# Patient Record
Sex: Female | Born: 1960 | Race: Black or African American | Hispanic: No | Marital: Single | Smoking: Never smoker
Health system: Southern US, Community
[De-identification: ages and names within clinical notes are randomized; demographics above are authoritative.]

## PROBLEM LIST (undated history)

## (undated) DIAGNOSIS — R569 Unspecified convulsions: Secondary | ICD-10-CM

## (undated) HISTORY — PX: EYE SURGERY: SHX253

---

## 2003-04-16 ENCOUNTER — Emergency Department (HOSPITAL_COMMUNITY): Admission: EM | Admit: 2003-04-16 | Discharge: 2003-04-17 | Payer: Self-pay | Admitting: Emergency Medicine

## 2004-11-03 ENCOUNTER — Emergency Department (HOSPITAL_COMMUNITY): Admission: EM | Admit: 2004-11-03 | Discharge: 2004-11-03 | Payer: Self-pay | Admitting: Emergency Medicine

## 2012-09-03 ENCOUNTER — Emergency Department (HOSPITAL_COMMUNITY)
Admission: EM | Admit: 2012-09-03 | Discharge: 2012-09-03 | Disposition: A | Discharge status: Home / Self Care | Payer: Self-pay | Attending: Emergency Medicine | Admitting: Emergency Medicine

## 2012-09-03 ENCOUNTER — Emergency Department (HOSPITAL_COMMUNITY): Payer: Self-pay

## 2012-09-03 ENCOUNTER — Encounter (HOSPITAL_COMMUNITY): Payer: Self-pay

## 2012-09-03 DIAGNOSIS — W268XXA Contact with other sharp object(s), not elsewhere classified, initial encounter: Secondary | ICD-10-CM | POA: Insufficient documentation

## 2012-09-03 DIAGNOSIS — Y92009 Unspecified place in unspecified non-institutional (private) residence as the place of occurrence of the external cause: Secondary | ICD-10-CM | POA: Insufficient documentation

## 2012-09-03 DIAGNOSIS — S61412A Laceration without foreign body of left hand, initial encounter: Secondary | ICD-10-CM

## 2012-09-03 DIAGNOSIS — S61409A Unspecified open wound of unspecified hand, initial encounter: Secondary | ICD-10-CM | POA: Insufficient documentation

## 2012-09-03 DIAGNOSIS — Y998 Other external cause status: Secondary | ICD-10-CM | POA: Insufficient documentation

## 2012-09-03 DIAGNOSIS — Z23 Encounter for immunization: Secondary | ICD-10-CM | POA: Insufficient documentation

## 2012-09-03 MED ORDER — OXYCODONE-ACETAMINOPHEN 5-325 MG PO TABS
2.0000 | ORAL_TABLET | Freq: Once | ORAL | Status: AC
  Administered 2012-09-03: 2 via ORAL
  Filled 2012-09-03: qty 2

## 2012-09-03 MED ORDER — TETANUS-DIPHTH-ACELL PERTUSSIS 5-2.5-18.5 LF-MCG/0.5 IM SUSP
0.5000 mL | Freq: Once | INTRAMUSCULAR | Status: AC
  Administered 2012-09-03: 0.5 mL via INTRAMUSCULAR
  Filled 2012-09-03: qty 0.5

## 2012-09-03 MED ORDER — HYDROCODONE-ACETAMINOPHEN 5-325 MG PO TABS: 1.0000 | ORAL_TABLET | Freq: Four times a day (QID) | ORAL | Status: DC | PRN

## 2012-09-03 MED ORDER — IBUPROFEN 800 MG PO TABS: 800.0000 mg | ORAL_TABLET | Freq: Three times a day (TID) | ORAL | Status: DC

## 2012-09-03 NOTE — ED Notes (Signed)
Laceration to her lt. Palm 1 inch bleeding controlled

## 2012-09-03 NOTE — ED Provider Notes (Signed)
History     CSN: [id number]  Arrival date & time 09/03/12  0725   First MD Initiated Contact with Patient 09/03/12 [phone]      Chief Complaint  Patient presents with  . Laceration    (Consider location/radiation/quality/duration/timing/severity/associated sxs/prior treatment) Patient is a 52 y.o. female presenting with skin laceration. The history is provided by the patient. No language interpreter was used.  Laceration Location:  Hand Hand laceration location:  L hand (Palmar aspect of L hand just distal to wrist joint) Length (cm):  2.5 Quality: straight   Bleeding: controlled   Time since incident:  1 hour Injury mechanism: Patient unsure of mechanism; was attempting to open a window at home and sliced hand. Denies breakage of glass. Pain details:    Severity:  Mild   Timing:  Constant   Progression:  Unchanged Foreign body present: No FB immediately visible. Worsened by:  Pressure Ineffective treatments:  None tried Tetanus status:  Unknown   History reviewed. No pertinent past medical history.  History reviewed. No pertinent past surgical history.  No family history on file.  History  Substance Use Topics  . Smoking status: Not on file  . Smokeless tobacco: Not on file  . Alcohol Use: Not on file    OB History   Grav Para Term Preterm Abortions TAB SAB Ect Mult Living                  Review of Systems  Constitutional:       "feeling queezy" 2/2 site of blood  Musculoskeletal: Positive for myalgias. Negative for joint swelling and arthralgias.  Skin: Positive for wound.  Neurological: Negative for weakness and numbness.  All other systems reviewed and are negative.    Allergies  Review of patient's allergies indicates no known allergies.  Home Medications  No current outpatient prescriptions on file.  BP 126/89  Pulse 99  Temp(Src) 98.2 F (36.8 C) (Oral)  Resp 18  SpO2 100%  LMP 09/03/2012  Physical Exam  Nursing note and vitals  reviewed. Constitutional: She is oriented to person, place, and time. She appears well-developed and well-nourished. No distress.  HENT:  Head: Normocephalic and atraumatic.  Eyes: Conjunctivae and EOM are normal. No scleral icterus.  Neck: Normal range of motion.  Cardiovascular: Normal rate, regular rhythm and intact distal pulses.   Capillary refill normal. Distal radial pulses 2+  Pulmonary/Chest: Effort normal. No respiratory distress.  Musculoskeletal:       Left wrist: Normal.       Left hand: She exhibits tenderness and laceration. She exhibits normal range of motion, no bony tenderness, normal two-point discrimination, normal capillary refill and no deformity. Normal sensation noted. Normal strength noted.       Hands: 2.5 cm laceration to palmar aspect of L hand just distal to L wrist joint. Patient exhibits normal grip strength. No decreased sensation or 2 pt discrimination appreciated. No limited ROM of wrist joint.  Neurological: She is alert and oriented to person, place, and time.  No sensory or motor deficits appreciated.  Skin: Skin is warm and dry. No rash noted. She is not diaphoretic. No erythema. No pallor.  Psychiatric: She has a normal mood and affect. Her behavior is normal.    ED Course  Procedures (including critical care time)  Labs Reviewed - No data to display Dg Hand Complete Left  09/03/2012   *RADIOLOGY REPORT*  Clinical Data: Laceration to palm of hand from a window, question radiopaque foreign  body  LEFT HAND - COMPLETE 3+ VIEW  Comparison: None  Findings: Mild diffuse osseous demineralization. Joint spaces preserved. No acute fracture, dislocation or bone destruction. No definite radiopaque foreign bodies. Dressing artifacts at volar aspect of the proximal hand and carpus.  IMPRESSION: Osseous demineralization. No definite radiopaque foreign bodies or acute osseous findings.   Original Report Authenticated By: Ulyses Southward, M.D.    LACERATION  REPAIR Performed by: Antony Madura Authorized by: Antony Madura Consent: Verbal consent obtained. Risks and benefits: risks, benefits and alternatives were discussed Consent given by: patient Patient identity confirmed: provided demographic data Prepped and Draped in normal sterile fashion Wound explored  Laceration Location: Palmar aspect of L hand  Laceration Length: 2.5cm  No Foreign Bodies seen or palpated  Anesthesia: local infiltration  Local anesthetic: lidocaine 2% without epinephrine  Anesthetic total: 2 ml  Irrigation method: syringe Amount of cleaning: standard  Skin closure: 4-0 black monofilament  Number of sutures: 2  Technique: horizontal mattress  Patient tolerance: Patient tolerated the procedure well with no immediate complications.    1. Hand laceration, left, initial encounter      MDM  Patient is a 52 year old female who presents for a laceration to the palmar aspect of her left hand. Patient neurovascularly intact an x-ray without evidence of radiopaque foreign body. There is no pallor, pulselessness, poikilothermia, or paresthesias. Patient given tetanus in the ED and laceration closed with 2 horizontal mattress sutures. Patient appropriate for discharge with hand specialist f/u. Patient told to have sutures removed in 7-10 days and given instruction on wound care. Indications for ED return provided. Patient states comfort and understanding with plan with no unaddressed concerns.        Antony Madura, PA-C 09/03/12 0930

## 2012-09-03 NOTE — Progress Notes (Signed)
9:15 AM Pt with laceration on left hand, with intact sensation and tendon function, no glass seen on x-ray, to have suture repair.

## 2012-09-04 NOTE — ED Provider Notes (Signed)
Medical screening examination/treatment/procedure(s) were conducted as a shared visit with non-physician practitioner(s) and myself.  I personally evaluated the patient during the encounter Pt suffered laceration of the left hand while trying to open a window.  The laceration is on the palm of the left hand just distal to the flexion crease of her wrist.  There is no loss of sensation or motor function in her hand.  The x-ray of her hand showed no glass.  Advised suture  Repair of laceration.  Carleene Cooper III, MD 09/04/12 1012

## 2012-09-11 ENCOUNTER — Emergency Department (HOSPITAL_COMMUNITY)
Admission: EM | Admit: 2012-09-11 | Discharge: 2012-09-11 | Disposition: A | Discharge status: Home / Self Care | Payer: Self-pay | Attending: Emergency Medicine | Admitting: Emergency Medicine

## 2012-09-11 DIAGNOSIS — Z4802 Encounter for removal of sutures: Secondary | ICD-10-CM

## 2012-09-11 DIAGNOSIS — T148XXA Other injury of unspecified body region, initial encounter: Secondary | ICD-10-CM

## 2012-09-11 NOTE — ED Notes (Signed)
Pt received stitches last Wednesday, came to have removed today

## 2012-09-11 NOTE — ED Provider Notes (Signed)
   History    CSN: [id number] Arrival date & time 09/11/12  0807  First MD Initiated Contact with Patient 09/11/12 [phone]     Chief Complaint  Patient presents with  . Suture / Staple Removal   (Consider location/radiation/quality/duration/timing/severity/associated sxs/prior Treatment) HPI  52 year old female presents emergency department for suture removal.  Patient was seen 1 week ago laceration to the left palm.  She states she is unsure how it happened but occurred while she was trying to open her window.  She cannot find an object which cut her hand.  Patient also complains of swelling and tenderness under the suture site.  She denies any heat, redness, discharge or signs of infection.  Patient denies any paresthesia of the fingers.  She is able to move her hand and fingers with full range of motion and strength. She denies any fevers, chills, nausea, vomiting, myalgias or arthralgias.  No past medical history on file. No past surgical history on file. No family history on file. History  Substance Use Topics  . Smoking status: Not on file  . Smokeless tobacco: Not on file  . Alcohol Use: Not on file   OB History   Grav Para Term Preterm Abortions TAB SAB Ect Mult Living                 Review of Systems As stated in history of present illness Allergies  Percocet  Home Medications  No current outpatient prescriptions on file. BP 125/96  Pulse 84  Temp(Src) 98.4 F (36.9 C) (Oral)  Resp 18  SpO2 100%  LMP 09/03/2012 Physical Exam Physical Exam  Nursing note and vitals reviewed. Constitutional: She is oriented to person, place, and time. She appears well-developed and well-nourished. No distress.  HENT:  Head: Normocephalic and atraumatic.  Eyes: Conjunctivae normal and EOM are normal. Pupils are equal, round, and reactive to light. No scleral icterus.  Neck: Normal range of motion.  Cardiovascular: Normal rate, regular rhythm and normal heart sounds.  Exam  reveals no gallop and no friction rub.   No murmur heard. Pulmonary/Chest: Effort normal and breath sounds normal. No respiratory distress.  Abdominal: Soft. Bowel sounds are normal. She exhibits no distension and no mass. There is no tenderness. There is no guarding.  Neurological: She is alert and oriented to person, place, and time.  Skin: Skin is warm and dry. She is not diaphoretic.  2.5 cm laceration to the left hand.  2 horizontal mattress sutures in place.  There is swelling and ecchymosis under the wound site without heat, redness, or discharge.  Site is tender to palpation.   ED Course  Procedures (including critical care time)  SUTURE REMOVAL Performed by: Arthor Captain  Consent: Verbal consent obtained. Patient identity confirmed: provided demographic data Time out: Immediately prior to procedure a "time out" was called to verify the correct patient, procedure, equipment, support staff and site/side marked as required.  Location details: L hand  Wound Appearance: clean  Sutures/Staples Removed: 2  Facility: sutures placed in this facility Patient tolerance: Patient tolerated the procedure well with no immediate complications.    Labs Reviewed - No data to display No results found. 1. Visit for suture removal   2. Hematoma     MDM  Patient with well healed laceration and hematoma. No signs of infection. The patient has been given instructions for wound care. Return precautions discussed.    Arthor Captain, PA-C 09/11/12 1637

## 2012-09-13 NOTE — ED Provider Notes (Signed)
Medical screening examination/treatment/procedure(s) were performed by non-physician practitioner and as supervising physician I was immediately available for consultation/collaboration.  Genasis Zingale, MD 09/13/12 1702 

## 2018-05-29 ENCOUNTER — Emergency Department (HOSPITAL_COMMUNITY): Payer: Self-pay

## 2018-05-29 ENCOUNTER — Encounter (HOSPITAL_COMMUNITY): Payer: Self-pay

## 2018-05-29 ENCOUNTER — Other Ambulatory Visit: Payer: Self-pay

## 2018-05-29 ENCOUNTER — Emergency Department (HOSPITAL_COMMUNITY)
Admission: EM | Admit: 2018-05-29 | Discharge: 2018-05-29 | Disposition: A | Discharge status: Home / Self Care | Payer: Self-pay | Attending: Emergency Medicine | Admitting: Emergency Medicine

## 2018-05-29 DIAGNOSIS — R1084 Generalized abdominal pain: Secondary | ICD-10-CM

## 2018-05-29 DIAGNOSIS — A5901 Trichomonal vulvovaginitis: Secondary | ICD-10-CM | POA: Insufficient documentation

## 2018-05-29 DIAGNOSIS — A599 Trichomoniasis, unspecified: Secondary | ICD-10-CM

## 2018-05-29 DIAGNOSIS — R1903 Right lower quadrant abdominal swelling, mass and lump: Secondary | ICD-10-CM | POA: Insufficient documentation

## 2018-05-29 DIAGNOSIS — Z91040 Latex allergy status: Secondary | ICD-10-CM | POA: Insufficient documentation

## 2018-05-29 LAB — URINALYSIS, ROUTINE W REFLEX MICROSCOPIC
BILIRUBIN URINE: NEGATIVE
GLUCOSE, UA: NEGATIVE mg/dL
Ketones, ur: NEGATIVE mg/dL
NITRITE: NEGATIVE
Protein, ur: NEGATIVE mg/dL
SPECIFIC GRAVITY, URINE: 1.035 — AB (ref 1.005–1.030)
pH: 7 (ref 5.0–8.0)

## 2018-05-29 LAB — BASIC METABOLIC PANEL
ANION GAP: 8 (ref 5–15)
BUN: 8 mg/dL (ref 6–20)
CO2: 24 mmol/L (ref 22–32)
CREATININE: 0.78 mg/dL (ref 0.44–1.00)
Calcium: 9.2 mg/dL (ref 8.9–10.3)
Chloride: 104 mmol/L (ref 98–111)
GFR calc non Af Amer: >60 mL/min (ref >60)
Glucose, Bld: 131 mg/dL — ABNORMAL HIGH (ref 70–99)
Potassium: 3.3 mmol/L — ABNORMAL LOW (ref 3.5–5.1)
SODIUM: 136 mmol/L (ref 135–145)

## 2018-05-29 LAB — CBC
HEMATOCRIT: 39.2 % (ref 36.0–46.0)
Hemoglobin: 12.8 g/dL (ref 12.0–15.0)
MCH: 30.4 pg (ref 26.0–34.0)
MCHC: 32.7 g/dL (ref 30.0–36.0)
MCV: 93.1 fL (ref 80.0–100.0)
NRBC: 0 % (ref 0.0–0.2)
Platelets: 321 10*3/uL (ref 150–400)
RBC: 4.21 MIL/uL (ref 3.87–5.11)
RDW: 13.2 % (ref 11.5–15.5)
WBC: 7.3 10*3/uL (ref 4.0–10.5)

## 2018-05-29 LAB — I-STAT TROPONIN, ED: Troponin i, poc: 0.01 ng/mL (ref 0.00–0.08)

## 2018-05-29 MED ORDER — SODIUM CHLORIDE 0.9 % IV SOLN
INTRAVENOUS | Status: DC
  Administered 2018-05-29: 19:00:00 via INTRAVENOUS

## 2018-05-29 MED ORDER — METRONIDAZOLE 500 MG PO TABS: 500.0000 mg | ORAL_TABLET | Freq: Two times a day (BID) | ORAL | 0 refills | Status: DC

## 2018-05-29 MED ORDER — SODIUM CHLORIDE 0.9% FLUSH
3.0000 mL | Freq: Once | INTRAVENOUS | Status: AC
  Administered 2018-05-29: 3 mL via INTRAVENOUS

## 2018-05-29 MED ORDER — IOHEXOL 300 MG/ML  SOLN
100.0000 mL | Freq: Once | INTRAMUSCULAR | Status: AC | PRN
  Administered 2018-05-29: 100 mL via INTRAVENOUS

## 2018-05-29 MED ORDER — METRONIDAZOLE 500 MG PO TABS
500.0000 mg | ORAL_TABLET | Freq: Once | ORAL | Status: AC
  Administered 2018-05-29: 500 mg via ORAL
  Filled 2018-05-29: qty 1

## 2018-05-29 NOTE — Discharge Instructions (Signed)
As discussed, it is important that you follow-up with our oncology gynecology team. Please be sure to ensure follow-up, possibly tomorrow.  Return here for concerning changes in your condition.

## 2018-05-29 NOTE — ED Triage Notes (Signed)
Pt states she has been having shortness of breath, weakness, dizziness X2 weeks. Reports some intermittent chest pain as well. Skin warm and dry. Vitals stable.

## 2018-05-29 NOTE — ED Provider Notes (Signed)
[facility] EMERGENCY DEPARTMENT Provider Note   CSN: [id number] Arrival date & time: 05/29/18  1134    History   Chief Complaint Chief Complaint  Patient presents with   Shortness of Breath    HPI Lindsey Ortega is a 58 y.o. female.     HPI Patient is very poor historian, presents with multiple complaints. Patient states that she is generally well, was so until about 2 weeks ago. Now over the past 2 weeks the patient has had progressive generalized weakness, soreness, as well as diffuse abdominal pain, nausea, vomiting, diarrhea. No reported fever, no chills. No ability to take medication for symptom relief. No confusion, disorientation, weakness in her extremities. Patient states that she is taking no prescription medication regularly, has not received her influenza vaccine. History reviewed. No pertinent past medical history.  There are no active problems to display for this patient.   History reviewed. No pertinent surgical history.   OB History   No obstetric history on file.      Home Medications    Prior to Admission medications   Not on File    Family History History reviewed. No pertinent family history.  Social History Social History   Tobacco Use   Smoking status: Never Smoker   Smokeless tobacco: Never Used  Substance Use Topics   Alcohol use: Yes    Comment: occasional    Drug use: Never     Allergies   Percocet [oxycodone-acetaminophen] and Latex   Review of Systems Review of Systems  Constitutional:       Per HPI, otherwise negative  HENT:       Per HPI, otherwise negative  Respiratory:       Per HPI, otherwise negative  Cardiovascular:       Per HPI, otherwise negative  Gastrointestinal: Positive for abdominal pain, diarrhea, nausea and vomiting.  Endocrine:       Negative aside from HPI  Genitourinary:       Neg aside from HPI   Musculoskeletal:       Per HPI, otherwise negative  Skin:  Negative.   Neurological: Positive for weakness. Negative for syncope.     Physical Exam Updated Vital Signs BP (!) 166/98    Pulse 67    Temp 98.8 F (37.1 C) (Oral)    Resp 17    SpO2 100%   Physical Exam Vitals signs and nursing note reviewed.  Constitutional:      General: She is not in acute distress.    Appearance: She is well-developed.  HENT:     Head: Normocephalic and atraumatic.  Eyes:     Conjunctiva/sclera: Conjunctivae normal.  Cardiovascular:     Rate and Rhythm: Normal rate and regular rhythm.  Pulmonary:     Effort: Pulmonary effort is normal. No respiratory distress.     Breath sounds: Normal breath sounds. No stridor.  Abdominal:     General: There is no distension.     Tenderness: There is generalized abdominal tenderness.  Skin:    General: Skin is warm and dry.  Neurological:     Mental Status: She is alert and oriented to person, place, and time.     Cranial Nerves: No cranial nerve deficit.      ED Treatments / Results  Labs (all labs ordered are listed, but only abnormal results are displayed) Labs Reviewed  BASIC METABOLIC PANEL - Abnormal; Notable for the following components:      Result Value  Potassium 3.3 (*)    Glucose, Bld 131 (*)    All other components within normal limits  URINALYSIS, ROUTINE W REFLEX MICROSCOPIC - Abnormal; Notable for the following components:   APPearance CLOUDY (*)    Specific Gravity, Urine 1.035 (*)    Hgb urine dipstick SMALL (*)    Leukocytes,Ua LARGE (*)    Bacteria, UA MANY (*)    Trichomonas, UA PRESENT (*)    All other components within normal limits  CBC  I-STAT TROPONIN, ED    EKG EKG Interpretation  Date/Time:  Thursday May 29 2018 11:42:47 EDT Ventricular Rate:  83 PR Interval:  164 QRS Duration: 74 QT Interval:  352 QTC Calculation: 413 R Axis:   63 Text Interpretation:  Normal sinus rhythm with sinus arrhythmia Borderline ECG Confirmed by Carmin Muskrat [phone]) on 05/29/2018  5:32:00 PM   Radiology Dg Chest 2 View  Result Date: 05/29/2018 CLINICAL DATA:  Acute chest pain and shortness of breath for 2 weeks. EXAM: CHEST - 2 VIEW COMPARISON:  None. FINDINGS: The cardiomediastinal silhouette is unremarkable. There is no evidence of focal airspace disease, pulmonary edema, suspicious pulmonary nodule/mass, pleural effusion, or pneumothorax. No acute bony abnormalities are identified. IMPRESSION: No active cardiopulmonary disease. Electronically Signed   By: Margarette Canada M.D.   On: 05/29/2018 13:28   Ct Abdomen Pelvis W Contrast  Result Date: 05/29/2018 CLINICAL DATA:  Abdominal pain. Diverticulitis suspected. EXAM: CT ABDOMEN AND PELVIS WITH CONTRAST TECHNIQUE: Multidetector CT imaging of the abdomen and pelvis was performed using the standard protocol following bolus administration of intravenous contrast. CONTRAST:  122mL OMNIPAQUE IOHEXOL 300 MG/ML  SOLN COMPARISON:  None. 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 There is no evidence for gallstones, gallbladder wall thickening, or pericholecystic fluid. No intrahepatic or extrahepatic biliary dilation. Pancreas: No focal mass lesion. No dilatation of the main duct. No intraparenchymal cyst. No peripancreatic edema. Spleen: No splenomegaly. No focal mass lesion. Adrenals/Urinary Tract: No adrenal nodule or mass. Kidneys unremarkable. No evidence for hydroureter. The urinary bladder appears normal for the degree of distention. Stomach/Bowel: Moderate hiatal hernia. Stomach otherwise unremarkable. Duodenum is normally positioned as is the ligament of Treitz. No small bowel wall thickening. No small bowel dilatation. The terminal ileum is normal. The appendix is normal. No gross colonic mass. No colonic wall thickening. Diverticular changes are noted in the  left colon without evidence of diverticulitis. Vascular/Lymphatic: No abdominal aortic aneurysm. No abdominal aortic atherosclerotic calcification. There is no gastrohepatic or hepatoduodenal ligament lymphadenopathy. No intraperitoneal or retroperitoneal lymphadenopathy. No pelvic sidewall lymphadenopathy. Reproductive: The uterus is unremarkable. 5.6 x 5.5 x 4.5 cm cystic and solid lesion is identified in the right adnexal space, displacing the uterus to the left. This lesion has an enhancing 4.0 x 3.1 x 4.0 cm soft tissue component. No left adnexal mass. Other: No intraperitoneal free fluid. Musculoskeletal: No worrisome lytic or sclerotic osseous abnormality. IMPRESSION: 1. 5.6 x 5.5 x 4.5 cm cystic and solid mass in the right adnexal space, displacing the uterus to the left. Imaging features are highly suspicious for ovarian neoplasm. Pelvic ultrasound may prove helpful to further evaluate. Gyn consultation suggested. 2. 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 3. Moderate hiatal hernia. Electronically Signed   By: Verda Cumins.D.  On: 05/29/2018 19:58    Procedures Procedures (including critical care time)  Medications Ordered in ED Medications  0.9 %  sodium chloride infusion ( Intravenous New Bag/Given 05/29/18 1903)  sodium chloride flush (NS) 0.9 % injection 3 mL (3 mLs Intravenous Given 05/29/18 1903)  iohexol (OMNIPAQUE) 300 MG/ML solution 100 mL (100 mLs Intravenous Contrast Given 05/29/18 1932)  metroNIDAZOLE (FLAGYL) tablet 500 mg (500 mg Oral Given 05/29/18 2144)     Initial Impression / Assessment and Plan / ED Course  I have reviewed the triage vital signs and the nursing notes.  Pertinent labs & imaging results that were available during my care of the patient were reviewed by me and considered in my medical decision making (see chart for details).        9:52  PM Repeat exam the patient is in similar condition. I discussed the patient's case with our gynecology oncology colleagues, to facilitate follow-up. CT concerning for new malignancy. Labs generally reassuring aside from abnormal urinalysis, positive for trichomonas. I discussed this with the patient, who notes that she had recent slip up. She received initial Flagyl here, will have a prescription for home use. We again discussed the abnormal CT, and the importance of close outpatient follow-up, which has been facilitated. With concern for malignancy, patient encouraged to follow-up tomorrow, and she should receive a call from the office as well.   Final Clinical Impressions(s) / ED Diagnoses  Abdominal pain Abdominal mass Trichomonas   Carmin Muskrat, MD 05/29/18 2155

## 2018-05-30 ENCOUNTER — Telehealth: Payer: Self-pay | Admitting: *Deleted

## 2018-05-30 NOTE — Telephone Encounter (Signed)
Called and scheduled the patient for a new patient appt. Patient scheduled to see Dr.Rossi on 3/18

## 2018-06-02 ENCOUNTER — Telehealth: Payer: Self-pay | Admitting: *Deleted

## 2018-06-02 NOTE — Telephone Encounter (Signed)
Called and spoke with the patient regarding her appt for Wednesday. I stated "we will still see you on Wednesday and to please limit to only one visitor with you. Please call and cancel if you are sick or have been around anyone that is sick; same goes for  Your visitor." Patient verbalized understanding

## 2018-06-04 ENCOUNTER — Other Ambulatory Visit: Payer: Self-pay

## 2018-06-04 ENCOUNTER — Encounter: Payer: Self-pay | Admitting: Gynecologic Oncology

## 2018-06-04 ENCOUNTER — Inpatient Hospital Stay: Payer: Self-pay | Attending: Gynecologic Oncology

## 2018-06-04 ENCOUNTER — Inpatient Hospital Stay (HOSPITAL_BASED_OUTPATIENT_CLINIC_OR_DEPARTMENT_OTHER): Payer: Self-pay | Admitting: Gynecologic Oncology

## 2018-06-04 VITALS — BP 140/94 | HR 86 | Temp 98.8°F | Resp 18 | Ht 65.5 in | Wt 141.6 lb

## 2018-06-04 DIAGNOSIS — R110 Nausea: Secondary | ICD-10-CM

## 2018-06-04 DIAGNOSIS — Z780 Asymptomatic menopausal state: Secondary | ICD-10-CM | POA: Insufficient documentation

## 2018-06-04 DIAGNOSIS — N83201 Unspecified ovarian cyst, right side: Secondary | ICD-10-CM | POA: Insufficient documentation

## 2018-06-04 DIAGNOSIS — R42 Dizziness and giddiness: Secondary | ICD-10-CM | POA: Insufficient documentation

## 2018-06-04 DIAGNOSIS — N9489 Other specified conditions associated with female genital organs and menstrual cycle: Secondary | ICD-10-CM

## 2018-06-04 MED ORDER — MECLIZINE HCL 12.5 MG PO TABS: 12.5000 mg | ORAL_TABLET | Freq: Three times a day (TID) | ORAL | 0 refills | Status: DC | PRN

## 2018-06-04 NOTE — Progress Notes (Signed)
Consult Note: Gyn-Onc  Consult was requested by Dr. Carmin Muskrat for the evaluation of Lindsey Ortega 58 y.o. female  CC:  Chief Complaint  Patient presents with  . Adnexal mass    Assessment/Plan:  Lindsey Ortega  is a 58 y.o.  year old with a 5.6cm cystic and solid right ovarian mass.  After reviewing the CT images, I have a low suspicion for malignancy. We will check tumor markers. If normal, we will plan to schedule a surgery for her in May, after current limitations on OR scheduling (COVID-19 pandemic) are relaxed, we will schedule her surgery (robotic assisted LSO).  If her tumor markers are elevated, we will likely proceed with surgery as soon as we have available time and space.   With respect to her dizziness and nausea, are unrelated to her pelvic mass which is an incidental finding on imaging.  I prescribed her meclizine presumptively for vertigo.  I discussed that she should follow-up with a primary care doctor regarding this symptom.  HPI: Lindsey Ortega is a 58 year old P3 who was seen in consultation at the request of Dr. Carmin Muskrat from the emergency department at [facility] for a right ovarian cyst.  The patient reports symptoms of intermittent nausea and dizziness for approximately 1 month.  She was seen in the ED at the [facility] where a CT scan of the abdomen and pelvis was performed on May 29, 2018 and this revealed a 5.6 x 5.5 x 5 4.5 cm cystic and solid mass in the right adnexal space displacing the uterus to the left.  There was no ascites carcinomatosis or lymphadenopathy.  The patient's past surgical history is unremarkable with the exception of eye surgery.  She underwent menopause approximately 1 year ago and has had no postmenopausal bleeding.  Her past medical history is insignificant however the patient does not have a primary care doctor and does not seek medical help usually and therefore it is unclear what underlying  health issues she has.  Her gynecologic obstetric history significant for 2 prior vaginal deliveries and 1 cesarean section.  She denies ever having an abnormal Pap smear, though is unclear if she has had much cervical cancer screening in her life.  Her family history is unremarkable and she has no gynecologic or breast malignancies or colon cancer in her family.  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   Current Meds:  Outpatient Encounter Medications as of 06/04/2018  Medication Sig  . meclizine (ANTIVERT) 12.5 MG tablet Take 1 tablet (12.5 mg total) by mouth 3 (three) times daily as needed for dizziness.  . metroNIDAZOLE (FLAGYL) 500 MG tablet Take 1 tablet (500 mg total) by mouth 2 (two) times daily. (Patient not taking: Reported on 06/04/2018)   No facility-administered encounter medications on file as of 06/04/2018.     Allergy:  Allergies  Allergen Reactions  . Percocet [Oxycodone-Acetaminophen] Nausea And Vomiting    Patient doesn't recall this ??  . Latex Rash    NO POWDERED GLOVES!!    Social Hx:   Social History   Socioeconomic History  . Marital status: Single    Spouse name: Not on file  . Number of children: Not on file  . Years of education: Not on file  . Highest education level: Not on file  Occupational History  . Not on file  Social Needs  . Financial resource strain: Not on file  .  Food insecurity:    Worry: Not on file    Inability: Not on file  . Transportation needs:    Medical: Not on file    Non-medical: Not on file  Tobacco Use  . Smoking status: Never Smoker  . Smokeless tobacco: Never Used  Substance and Sexual Activity  . Alcohol use: Yes    Comment: occasional   . Drug use: Never  . Sexual activity: Not Currently  Lifestyle  . Physical activity:    Days per week: Not on file    Minutes per session: Not on file  . Stress: Not on file  Relationships  . Social connections:    Talks on  phone: Not on file    Gets together: Not on file    Attends religious service: Not on file    Active member of club or organization: Not on file    Attends meetings of clubs or organizations: Not on file    Relationship status: Not on file  . Intimate partner violence:    Fear of current or ex partner: Not on file    Emotionally abused: Not on file    Physically abused: Not on file    Forced sexual activity: Not on file  Other Topics Concern  . Not on file  Social History Narrative  . Not on file    Past Surgical Hx: History reviewed. No pertinent surgical history.  Past Medical Hx: History reviewed. No pertinent past medical history.  Past Gynecological History:  See HPI No LMP recorded.  Family Hx: History reviewed. No pertinent family history.  Review of Systems:  Constitutional  Feels well,    ENT Normal appearing ears and nares bilaterally Skin/Breast  No rash, sores, jaundice, itching, dryness Cardiovascular  No chest pain, shortness of breath, or edema  Pulmonary  No cough or wheeze.  Gastro Intestinal  + nausea Genito Urinary  No frequency, urgency, dysuria, no bleeding Musculo Skeletal  No myalgia, arthralgia, joint swelling or pain  Neurologic  dizziness  Psychology  No depression, anxiety, insomnia.   Vitals:  Blood pressure (!) 140/94, pulse 86, temperature 98.8 F (37.1 C), temperature source Oral, resp. rate 18, height 5' 5.5" (1.664 m), weight 141 lb 9.6 oz (64.2 kg), SpO2 100 %.  Physical Exam: WD in NAD Neck  Supple NROM, without any enlargements.  Lymph Node Survey No cervical supraclavicular or inguinal adenopathy Cardiovascular  Pulse normal rate, regularity and rhythm. S1 and S2 normal.  Lungs  Clear to auscultation bilateraly, without wheezes/crackles/rhonchi. Good air movement.  Skin  No rash/lesions/breakdown  Psychiatry  Alert and oriented to person, place, and time  Abdomen  Normoactive bowel sounds, abdomen soft, non-tender  and obese without evidence of hernia.  Back No CVA tenderness Genito Urinary  Vulva/vagina: Normal external female genitalia.  No lesions. No discharge or bleeding.  Bladder/urethra:  No lesions or masses, well supported bladder  Vagina: normal  Cervix: Normal appearing, no lesions.  Uterus:  Small, mobile, no parametrial involvement or nodularity.  Adnexa: no palpable masses. Rectal  Good tone, no masses no cul de sac nodularity.  Extremities  No bilateral cyanosis, clubbing or edema.   Thereasa Solo, MD  06/04/2018, 5:12 PM

## 2018-06-04 NOTE — Patient Instructions (Signed)
Dr Denman George is checking your tumor marker.  If this is elevated she will schedule a surgery to remove the right ovary as soon as she has clearance from the hospital to do so. If this is normal, she will see you back for a checkup in May to discuss whether you would like to proceed with surgery.  Her contact office is [place].  Dr Denman George recommends that you try meclizine tablets for your nausea and dizziness. If this does not help, you should see a primary care doctor to be evaluated.   She is clearing you to return to work.

## 2018-06-05 ENCOUNTER — Telehealth: Payer: Self-pay

## 2018-06-05 LAB — CA 125: CANCER ANTIGEN (CA) 125: 14.7 U/mL (ref 0.0–38.1)

## 2018-06-05 NOTE — Telephone Encounter (Signed)
Outgoing call to patient regarding recent CA 125 result as "within normal range" per Joylene John NP- pt voiced understanding and asked if still has to have surgery?  Per Lenna Sciara NP, 'this is good CA 125 result but still needs surgery to remove mass and test by pathology, will schedule surgery in May.' and reminded her to call if any worsening of symptoms before May- pt voiced understanding- no other needs per pt at this time.

## 2018-06-10 ENCOUNTER — Telehealth: Payer: Self-pay | Admitting: *Deleted

## 2018-06-10 ENCOUNTER — Telehealth (HOSPITAL_BASED_OUTPATIENT_CLINIC_OR_DEPARTMENT_OTHER): Payer: Self-pay | Admitting: Gynecologic Oncology

## 2018-06-10 DIAGNOSIS — N83201 Unspecified ovarian cyst, right side: Secondary | ICD-10-CM

## 2018-06-10 NOTE — Telephone Encounter (Signed)
Scheduled the [place] scan per Dr. Denman George, scan is for 6/2 at [place] and gave the patient the appt date/time and information regarding a full bladder.

## 2018-06-10 NOTE — Telephone Encounter (Signed)
Informed patient of normal Ca1 25 of 14.7 that was drawn on June 04, 2018.  I discussed that I do not feel that her cystic right ovarian mass is malignant.  I feel that is safe for her to wait at least 3 months.  I discussed that this mass was not the cause of her symptoms of dizziness and nausea and emesis.  The patient requested that I write her out for work due to her dizziness and nausea.  I stated that I cannot do this as I am not treating her for that condition and that she would need to see a primary care doctor to diagnose her with a condition that necessitated leave from work.  She does not require leave from work for this right ovarian mass.  I discussed that our plan will be for me to see her again in early June 2020 with a transvaginal ultrasound scan to reevaluate the mass.  If she still desires its removal and it still is present we will remove it at that time.

## 2018-06-23 ENCOUNTER — Other Ambulatory Visit: Payer: Self-pay

## 2018-06-23 ENCOUNTER — Ambulatory Visit (HOSPITAL_COMMUNITY)
Admission: EM | Admit: 2018-06-23 | Discharge: 2018-06-23 | Disposition: A | Discharge status: Home / Self Care | Payer: Self-pay | Attending: Emergency Medicine | Admitting: Emergency Medicine

## 2018-06-23 ENCOUNTER — Encounter (HOSPITAL_COMMUNITY): Payer: Self-pay

## 2018-06-23 DIAGNOSIS — M26621 Arthralgia of right temporomandibular joint: Secondary | ICD-10-CM

## 2018-06-23 DIAGNOSIS — R42 Dizziness and giddiness: Secondary | ICD-10-CM

## 2018-06-23 MED ORDER — DIAZEPAM 2 MG PO TABS: 1.0000 mg | ORAL_TABLET | Freq: Two times a day (BID) | ORAL | 0 refills | Status: AC | PRN

## 2018-06-23 MED ORDER — IBUPROFEN 600 MG PO TABS: 600.0000 mg | ORAL_TABLET | Freq: Four times a day (QID) | ORAL | 0 refills | Status: DC | PRN

## 2018-06-23 MED ORDER — ONDANSETRON 4 MG PO TBDP: 4.0000 mg | ORAL_TABLET | Freq: Three times a day (TID) | ORAL | 0 refills | Status: DC | PRN

## 2018-06-23 NOTE — ED Provider Notes (Signed)
HPI  SUBJECTIVE:  Lindsey Ortega is a 58 y.o. female who presents with 5 to 6 weeks of daily dizziness described as vertigo that lasts hours, accompanied with nausea.  Reports occasional palpitations with it.  She reports one episode of emesis last weekend, but thinks that this was due to eating beef which she does not eat on a regular basis.  She denies facial droop, arm or leg weakness, slurred speech, preceding URI or preceding antibiotic.  No chest pain, shortness of breath, syncope.  She also reports throbbing right ear pain with intermittent tinnitus for the past 2 weeks.  denies change in hearing, foreign body insertion.  Questionable clear otorrhea for the past week.  She states that the pain gets better during the day.  She denies grinding her teeth at night.  She has tried meclizine with worsening of her dizziness, ginger ale for the nausea.  She states that the dizziness is better with lying down, worse in the morning, and worse with head movement, large positional changes.  Denies melena, hematochezia, vaginal bleeding, hematuria, epistaxis.  She was seen in the ER on 3/12 with progressive generalized weakness, diffuse abdominal pain, nausea vomiting diarrhea.  Had a CT done which found a solid mass in the right adnexal space suspicious for ovarian neoplasm.  This was discussed with patient and she was to follow-up closely with gynecology/oncology.   She also had trichomonas which was treated with Flagyl. No mention of dizziness that I can find in the chart.   She was complaining of one month of dizziness and nausea when she saw gyn/onc, and was prescribed meclizine on 3/18, encouraged to follow-up with a primary care physician of her choice.  Past medical history negative for atrial fibrillation, SVT, arrhythmia, stroke, Mnire's disease, DM, hypertension, kidney disease, GI bleed.  PMH positive for anemia as a child.  PMD: None.  History reviewed. No pertinent past medical  history.  History reviewed. No pertinent surgical history.  Family History  Problem Relation Age of Onset  . Healthy Mother   . Healthy Father     Social History   Tobacco Use  . Smoking status: Never Smoker  . Smokeless tobacco: Never Used  Substance Use Topics  . Alcohol use: Yes    Comment: occasional   . Drug use: Never    No current facility-administered medications for this encounter.   Current Outpatient Medications:  .  diazepam (VALIUM) 2 MG tablet, Take 0.5 tablets (1 mg total) by mouth every 12 (twelve) hours as needed for up to 10 days (Vertigo/dizziness)., Disp: 10 tablet, Rfl: 0 .  ibuprofen (ADVIL,MOTRIN) 600 MG tablet, Take 1 tablet (600 mg total) by mouth every 6 (six) hours as needed., Disp: 30 tablet, Rfl: 0 .  metroNIDAZOLE (FLAGYL) 500 MG tablet, Take 1 tablet (500 mg total) by mouth 2 (two) times daily. (Patient not taking: Reported on 06/04/2018), Disp: 14 tablet, Rfl: 0 .  ondansetron (ZOFRAN ODT) 4 MG disintegrating tablet, Take 1 tablet (4 mg total) by mouth every 8 (eight) hours as needed for nausea or vomiting., Disp: 20 tablet, Rfl: 0  Allergies  Allergen Reactions  . Percocet [Oxycodone-Acetaminophen] Nausea And Vomiting    Patient doesn't recall this ??  . Latex Rash    NO POWDERED GLOVES!!     ROS  As noted in HPI.   Physical Exam  BP (!) 151/106 (BP Location: Right Arm)   Pulse (!) 102   Temp 98.5 F (36.9 C) (Oral)  Resp 18   Wt 63.5 kg   LMP 09/03/2012   SpO2 97%   BMI 22.94 kg/m   Orthostatic VS for the past 24 hrs:  BP- Lying Pulse- Lying BP- Sitting Pulse- Sitting BP- Standing at 0 minutes Pulse- Standing at 0 minutes  06/23/18 1239 137/83 95 (!) 145/96 96 (!) 156/95 102    Constitutional: Well developed, well nourished, no acute distress Eyes: PERRLA, EOMI, conjunctiva normal bilaterally HENT: Normocephalic, atraumatic,mucus membranes moist.  TMs normal bilaterally.  Positive tenderness to the right TMJ.  No  crepitus.  No nasal congestion. Respiratory: Normal inspiratory effort Cardiovascular: Normal rate, regular rhythm, no murmurs, rubs, gallops.  No carotid bruit. GI: nondistended skin: No rash, skin intact Musculoskeletal: no deformities Neurologic: Alert & oriented x 3, cranial nerves III through XII intact, finger- nose, heel-shin normal bilaterally, Romberg negative, tandem gait steady.  Positive Dix-Hallpike right side.  Symptoms also aggravated with sitting up.Marland Kitchen Psychiatric: Speech and behavior appropriate nose,   ED Course   Medications - No data to display  No orders of the defined types were placed in this encounter.   No results found for this or any previous visit (from the past 24 hour(s)). No results found.  ED Clinical Impression  Vertigo  Arthralgia of right temporomandibular joint   ED Assessment/Plan  ER records reviewed.  As noted in HPI.  presentation consistent with a peripheral vertigo. Patient is not orthostatic.  She does have a positive Dix-Hallpike on the right side.  Because the meclizine has not worked, will send home with Epley maneuver for the right side, diazepam 1 mg every 12 hours, for to ENT.  [facility] ENT on-call.    She also has TMJ arthralgia which I suspect is causing her ear pain.  Will send home with Flexeril at night, ibuprofen 600 mg combined with 1 g of Tylenol 3-4 times a day as needed for pain, soft diet for the next 4- 5 days.  Providing primary care list for ongoing care.  Discussed MDM, treatment plan, and plan for follow-up with patient. Discussed sn/sx that should prompt return to the ED. patient agrees with plan.   Meds ordered this encounter  Medications  . diazepam (VALIUM) 2 MG tablet    Sig: Take 0.5 tablets (1 mg total) by mouth every 12 (twelve) hours as needed for up to 10 days (Vertigo/dizziness).    Dispense:  10 tablet    Refill:  0  . ondansetron (ZOFRAN ODT) 4 MG disintegrating tablet    Sig: Take 1 tablet (4  mg total) by mouth every 8 (eight) hours as needed for nausea or vomiting.    Dispense:  20 tablet    Refill:  0  . ibuprofen (ADVIL,MOTRIN) 600 MG tablet    Sig: Take 1 tablet (600 mg total) by mouth every 6 (six) hours as needed.    Dispense:  30 tablet    Refill:  0    *This clinic note was created using [employer]. Therefore, there may be occasional mistakes despite careful proofreading.   ?    Melynda Ripple, MD 06/23/18 [phone]

## 2018-06-23 NOTE — ED Triage Notes (Signed)
Pt cc nausea , dizziness, vertigo and ear discomfort . Pt states she was seen in the ER 2 weeks ago for vertigo. Pt states the med did not work . Pt states the medicine made her feel worst.

## 2018-06-23 NOTE — Discharge Instructions (Addendum)
Try the Epley maneuver starting on your right side.  Stop the meclizine because it is not working, try the Valium instead.  Zofran for nausea.  As for your ear pain, I suspect that this is from your jaw joint.  Flexeril at night, ibuprofen 600 mg combined with 1 g of Tylenol 3-4 times a day as needed for pain, soft diet for the next 4 to 5 days.  Follow-up with a primary care physician as soon as you possibly can.  Below is a list of primary care practices who are taking new patients for you to follow-up with.  [facility] at [facility] [address] [place], [place] [address] [phone]  [place], [place] [address] [phone]  [facility] [phone] [place] [address]  [facility]: [place]  [phone]  [place] [facility]: [place]   [phone]  [facility]: [address] [place]  [phone]  [place] primary care : [address] [place] [phone]  [facility]: [address] [place] [address] [phone]  [facility]: [place] [phone]  Dr. Blanchie Serve [place]  [phone]  Dr. Benito Mccreedy, [facility]. [place], [place] [address]  [phone]  Go to www.goodrx.com to look up your medications. This will give you a list of where you can find your prescriptions at the most affordable prices. Or ask the pharmacist what the cash price is, or if they have any other discount programs available to help  make your medication more affordable. This can be less expensive than what you would pay with insurance.

## 2018-06-23 NOTE — ED Notes (Signed)
Patient states some dizziness with position change from sitting to standing. MD aware, will continue to monitor.

## 2018-06-24 ENCOUNTER — Telehealth (HOSPITAL_COMMUNITY): Payer: Self-pay | Admitting: Emergency Medicine

## 2018-06-24 NOTE — Telephone Encounter (Signed)
  Patient wanted a work note to return one week from now, Monday.  Notified patient we have a 3 days policy and provided her with a copy of her paperwork from visit, highlighting referral for ENT.  Also, provided a work note in accordance with policy.  Told patient to call ENT, PCP, or check in to be seen.

## 2018-07-02 ENCOUNTER — Telehealth: Payer: Self-pay | Admitting: *Deleted

## 2018-07-02 NOTE — Telephone Encounter (Signed)
Called and left the patient a message to call the office back. Patient needs to scheduled to see Dr. Denman George in June after her [place] scan.

## 2018-07-18 ENCOUNTER — Telehealth: Payer: Self-pay | Admitting: Gynecologic Oncology

## 2018-07-18 NOTE — Telephone Encounter (Signed)
Gynecologic Oncology Telehealth Follow-up Note: Gyn-Onc  I connected with Lindsey Ortega on  at  by telephone on 06/13/18 at 12 noon and verified that I am speaking with the correct person using two identifiers.  Other persons participating in the visit and their role in the encounter: none.  Patient's location: Home Provider's location: [place]  Chief Complaint:  Chief Complaint  Patient presents with  . Results   Informed patient of normal Ca1 25 of 14.7 that was drawn on June 04, 2018.  I discussed that I do not feel that her cystic right ovarian mass is malignant.  I feel that is safe for her to wait at least 3 months.  I discussed that this mass was not the cause of her symptoms of dizziness and nausea and emesis.  The patient requested that I write her out for work due to her dizziness and nausea.  I stated that I cannot do this as I am not treating her for that condition and that she would need to see a primary care doctor to diagnose her with a condition that necessitated leave from work.  She does not require leave from work for this right ovarian mass.  I discussed that our plan will be for me to see her again in early June 2020 with a transvaginal ultrasound scan to reevaluate the mass.  If she still desires its removal and it still is present we will remove it at that time.  I discussed the assessment and treatment plan with the patient. The patient was provided with an opportunity to ask questions and all were answered. The patient agreed with the plan and demonstrated an understanding of the instructions.   The patient was advised to call back or see an in-person evaluation if the symptoms worsen or if the condition fails to improve as anticipated.   I provided 10 minutes of non face-to-face telephone visit time during this encounter, and > 50% was spent counseling as documented under my assessment & plan.

## 2018-08-19 ENCOUNTER — Other Ambulatory Visit: Payer: Self-pay

## 2018-08-19 ENCOUNTER — Ambulatory Visit (HOSPITAL_COMMUNITY)
Admission: RE | Admit: 2018-08-19 | Discharge: 2018-08-19 | Disposition: A | Discharge status: Home / Self Care | Payer: Self-pay | Source: Ambulatory Visit | Attending: Gynecologic Oncology | Admitting: Gynecologic Oncology

## 2018-08-19 DIAGNOSIS — N83201 Unspecified ovarian cyst, right side: Secondary | ICD-10-CM | POA: Insufficient documentation

## 2018-08-20 ENCOUNTER — Inpatient Hospital Stay: Payer: Self-pay | Attending: Gynecologic Oncology | Admitting: Gynecologic Oncology

## 2018-08-20 ENCOUNTER — Other Ambulatory Visit: Payer: Self-pay

## 2018-08-20 ENCOUNTER — Encounter: Payer: Self-pay | Admitting: Gynecologic Oncology

## 2018-08-20 VITALS — BP 139/98 | HR 83 | Temp 99.5°F | Resp 18 | Ht 65.5 in | Wt 143.0 lb

## 2018-08-20 DIAGNOSIS — N83201 Unspecified ovarian cyst, right side: Secondary | ICD-10-CM | POA: Insufficient documentation

## 2018-08-20 NOTE — Progress Notes (Signed)
Follow-up Note: Gyn-Onc  Consult was initially requested by Dr. Carmin Muskrat for the evaluation of Lindsey Ortega 58 y.o. female  CC:  Chief Complaint  Patient presents with  . Right ovarian cyst    Assessment/Plan:  Lindsey Ortega  is a 58 y.o.  year old with a 5.6cm cystic and solid right ovarian mass and normal CA 125  After reviewing the CT images, I have a low suspicion for malignancy, though with the solid component within the ovary it is recommended that it be removed for pathologic evaluation. .  I am recommending robotic assisted RSO, with possible hysterectomy/BSO/staging if malignancy is identified. I discussed the surgical procedure and plan with the patient. I discussed anticipated postoperative outpatient stay and recovery. I discussed surgical risk including  bleeding, infection, damage to internal organs (such as bladder,ureters, bowels), blood clot, reoperation and rehospitalization.  She is in agreement to proceed with surgery as planned. To expedite surgery she will have the surgery performed with my partner, Dr [name].   HPI: [name] is a 58 year old P3 who was seen in consultation at the request of Dr. Carmin Muskrat from the emergency department at [facility] for a right ovarian cyst.  The patient reports symptoms of intermittent nausea and dizziness for approximately 1 month.  She was seen in the ED at the [facility] where a CT scan of the abdomen and pelvis was performed on May 29, 2018 and this revealed a 5.6 x 5.5 x 5 4.5 cm cystic and solid mass in the right adnexal space displacing the uterus to the left.  There was no ascites carcinomatosis or lymphadenopathy.  The patient's past surgical history is unremarkable with the exception of eye surgery.  She underwent menopause approximately 1 year ago and has had no postmenopausal bleeding.  Her past medical history is insignificant however the patient does not have a  primary care doctor and does not seek medical help usually and therefore it is unclear what underlying health issues she has.  Her gynecologic obstetric history significant for 2 prior vaginal deliveries and 1 cesarean section.  She denies ever having an abnormal Pap smear, though is unclear if she has had much cervical cancer screening in her life.  Her family history is unremarkable and she has no gynecologic or breast malignancies or colon cancer in her family.  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  Interval Hx:  CA 125 was assessed on 06/04/18 and was normal at 14.7.  Her surgical plan was delayed due to the COVID-19 crisis, lack of OR availability for elective cases, and a low suspicion for malignancy in an asymptomatic mass.  A repeat US was performed on 05/29/18 which showed a normal appearing uterus with 60mm endometrium. The right ovary measured 5.3x4.4x5.6cm with a solid and cystic mass. The left ovary was not seen.  No free fluid was seen.   Current Meds:  Outpatient Encounter Medications as of 08/20/2018  Medication Sig  . [DISCONTINUED] ibuprofen (ADVIL,MOTRIN) 600 MG tablet Take 1 tablet (600 mg total) by mouth every 6 (six) hours as needed.  . [DISCONTINUED] metroNIDAZOLE (FLAGYL) 500 MG tablet Take 1 tablet (500 mg total) by mouth 2 (two) times daily. (Patient not taking: Reported on 06/04/2018)  . [DISCONTINUED] ondansetron (ZOFRAN ODT) 4 MG disintegrating tablet Take 1 tablet (4 mg total) by mouth every 8 (eight) hours as needed for nausea or vomiting.   No  facility-administered encounter medications on file as of 08/20/2018.     Allergy:  Allergies  Allergen Reactions  . Percocet [Oxycodone-Acetaminophen] Nausea And Vomiting    Patient doesn't recall this ??  . Latex Rash    NO POWDERED GLOVES!!    Social Hx:   Social History   Socioeconomic History  . Marital status: Single    Spouse name: Not on file  . Number of  children: Not on file  . Years of education: Not on file  . Highest education level: Not on file  Occupational History  . Not on file  Social Needs  . Financial resource strain: Not on file  . Food insecurity:    Worry: Not on file    Inability: Not on file  . Transportation needs:    Medical: Not on file    Non-medical: Not on file  Tobacco Use  . Smoking status: Never Smoker  . Smokeless tobacco: Never Used  Substance and Sexual Activity  . Alcohol use: Yes    Comment: occasional   . Drug use: Never  . Sexual activity: Not Currently  Lifestyle  . Physical activity:    Days per week: Not on file    Minutes per session: Not on file  . Stress: Not on file  Relationships  . Social connections:    Talks on phone: Not on file    Gets together: Not on file    Attends religious service: Not on file    Active member of club or organization: Not on file    Attends meetings of clubs or organizations: Not on file    Relationship status: Not on file  . Intimate partner violence:    Fear of current or ex partner: Not on file    Emotionally abused: Not on file    Physically abused: Not on file    Forced sexual activity: Not on file  Other Topics Concern  . Not on file  Social History Narrative  . Not on file    Past Surgical Hx: History reviewed. No pertinent surgical history.  Past Medical Hx: History reviewed. No pertinent past medical history.  Past Gynecological History:  See HPI Patient's last menstrual period was 09/03/2012.  Family Hx:  Family History  Problem Relation Age of Onset  . Healthy Mother   . Healthy Father     Review of Systems:  Constitutional  Feels well,    ENT Normal appearing ears and nares bilaterally Skin/Breast  No rash, sores, jaundice, itching, dryness Cardiovascular  No chest pain, shortness of breath, or edema  Pulmonary  No cough or wheeze.  Gastro Intestinal  + nausea Genito Urinary  No frequency, urgency, dysuria, no  bleeding Musculo Skeletal  No myalgia, arthralgia, joint swelling or pain  Neurologic  dizziness  Psychology  No depression, anxiety, insomnia.   Vitals:  Blood pressure (!) 139/98, pulse 83, temperature 99.5 F (37.5 C), temperature source Oral, resp. rate 18, height 5' 5.5" (1.664 m), weight 143 lb (64.9 kg), last menstrual period 09/03/2012, SpO2 100 %.  Physical Exam: WD in NAD Neck  Supple NROM, without any enlargements.  Lymph Node Survey No cervical supraclavicular or inguinal adenopathy Cardiovascular  Pulse normal rate, regularity and rhythm. S1 and S2 normal.  Lungs  Clear to auscultation bilateraly, without wheezes/crackles/rhonchi. Good air movement.  Skin  No rash/lesions/breakdown  Psychiatry  Alert and oriented to person, place, and time  Abdomen  Normoactive bowel sounds, abdomen soft, non-tender and obese without evidence  of hernia.  Back No CVA tenderness Genito Urinary  Vulva/vagina: Normal external female genitalia.  No lesions. No discharge or bleeding.  Bladder/urethra:  No lesions or masses, well supported bladder  Vagina: normal  Cervix: Normal appearing, no lesions.  Uterus:  Small, mobile, no parametrial involvement or nodularity.  Adnexa: no palpable masses. Rectal  Good tone, no masses no cul de sac nodularity.  Extremities  No bilateral cyanosis, clubbing or edema.   Thereasa Solo, MD  08/20/2018, 12:37 PM

## 2018-08-20 NOTE — Patient Instructions (Signed)
Preparing for your Surgery  Plan for surgery on September 09, 2018 with Dr. [name] at [facility] will be scheduled for a robotic assisted unilateral salpingo-oophorectomy, possible total laparoscopic hysterectomy, possible bilateral salpingo-oophorectomy, possible staging.   Pre-operative Testing -You will receive a phone call from presurgical testing at [facility] if you have not received a call already to arrange for a pre-operative testing appointment before your surgery.  This appointment normally occurs one to two weeks before your scheduled surgery.   -Bring your insurance card, copy of an advanced directive if applicable, medication list  -At that visit, you will be asked to sign a consent for a possible blood transfusion in case a transfusion becomes necessary during surgery.  The need for a blood transfusion is rare but having consent is a necessary part of your care.     -You should not be taking blood thinners or aspirin at least ten days prior to surgery unless instructed by your surgeon.  -Do not take supplements such as fish oil (omega 3), red yeast rice, tumeric before your surgery.  Day Before Surgery at [facility] will be asked to take in a light diet the day before surgery.  Avoid carbonated beverages.  You will be advised to have nothing to eat or drink after midnight the evening before.    Eat a light diet the day before surgery.  Examples including soups, broths, toast, yogurt, mashed potatoes.  Things to avoid include carbonated beverages (fizzy beverages), raw fruits and raw vegetables, or beans.   If your bowels are filled with gas, your surgeon will have difficulty visualizing your pelvic organs which increases your surgical risks.  Your role in recovery Your role is to become active as soon as directed by your doctor, while still giving yourself time to heal.  Rest when you feel tired. You will be asked to do the following in order to speed  your recovery:  - Cough and breathe deeply. This helps toclear and expand your lungs and can prevent pneumonia. You may be given a spirometer to practice deep breathing. A staff member will show you how to use the spirometer. - Do mild physical activity. Walking or moving your legs help your circulation and body functions return to normal. A staff member will help you when you try to walk and will provide you with simple exercises. Do not try to get up or walk alone the first time. - Actively manage your pain. Managing your pain lets you move in comfort. We will ask you to rate your pain on a scale of zero to 10. It is your responsibility to tell your doctor or nurse where and how much you hurt so your pain can be treated.  Special Considerations -If you are diabetic, you may be placed on insulin after surgery to have closer control over your blood sugars to promote healing and recovery.  This does not mean that you will be discharged on insulin.  If applicable, your oral antidiabetics will be resumed when you are tolerating a solid diet.  -Your final pathology results from surgery should be available around one week after surgery and the results will be relayed to you when available.  -Dr. Lahoma Crocker is the surgeon that assists your GYN Oncologist with surgery.  If you end up staying the night, the next day after your surgery you will either see Dr. Denman George or Dr. Lahoma Crocker.  -FMLA forms can be faxed to [phone] and  please allow 5-7 business days for completion.   Blood Transfusion Information WHAT IS A BLOOD TRANSFUSION? A transfusion is the replacement of blood or some of its parts. Blood is made up of multiple cells which provide different functions.  Red blood cells carry oxygen and are used for blood loss replacement.  White blood cells fight against infection.  Platelets control bleeding.  Plasma helps clot blood.  Other blood products are available for  specialized needs, such as hemophilia or other clotting disorders. BEFORE THE TRANSFUSION  Who gives blood for transfusions?   You may be able to donate blood to be used at a later date on yourself (autologous donation).  Relatives can be asked to donate blood. This is generally not any safer than if you have received blood from a stranger. The same precautions are taken to ensure safety when a relative's blood is donated.  Healthy volunteers who are fully evaluated to make sure their blood is safe. This is blood bank blood. Transfusion therapy is the safest it has ever been in the practice of medicine. Before blood is taken from a donor, a complete history is taken to make sure that person has no history of diseases nor engages in risky social behavior (examples are intravenous drug use or sexual activity with multiple partners). The donor's travel history is screened to minimize risk of transmitting infections, such as malaria. The donated blood is tested for signs of infectious diseases, such as HIV and hepatitis. The blood is then tested to be sure it is compatible with you in order to minimize the chance of a transfusion reaction. If you or a relative donates blood, this is often done in anticipation of surgery and is not appropriate for emergency situations. It takes many days to process the donated blood. RISKS AND COMPLICATIONS Although transfusion therapy is very safe and saves many lives, the main dangers of transfusion include:   Getting an infectious disease.  Developing a transfusion reaction. This is an allergic reaction to something in the blood you were given. Every precaution is taken to prevent this. The decision to have a blood transfusion has been considered carefully by your caregiver before blood is given. Blood is not given unless the benefits outweigh the risks.  08/20/2018  AFTER SURGERY CONSIDERATIONS  Return to work: 4-6 weeks if applicable  Activity: 1. Be up and out  of the bed during the day.  Take a nap if needed.  You may walk up steps but be careful and use the hand rail.  Stair climbing will tire you more than you think, you may need to stop part way and rest.   2. No lifting or straining for 6 weeks.  3. No driving for 1 week(s).  Do not drive if you are taking narcotic pain medicine.  4. Shower daily.  Use soap and water on your incision and pat dry; don't rub.  No tub baths until cleared by your surgeon.   5. No sexual activity and nothing in the vagina for 8 weeks if you have a hysterectomy.  6. You may experience a small amount of clear drainage from your incisions, which is normal.  If the drainage persists or increases, please call the office.  7. You may experience vaginal spotting after surgery or around the 6-8 week mark from surgery when the stitches at the top of the vagina begin to dissolve.  The spotting is normal but if you experience heavy bleeding, call our office.  8. Take  Tylenol or ibuprofen first for pain and only use narcotic pain medication for severe pain not relieved by the Tylenol or Ibuprofen.  Monitor your Tylenol intake to a max of 4,000 mg a day.  Diet: 1. Low sodium Heart Healthy Diet is recommended.  2. It is safe to use a laxative, such as Miralax or Colace, if you have difficulty moving your bowels. You can take Sennakot at bedtime every evening to keep bowel movements regular and to prevent constipation.    Wound Care: 1. Keep clean and dry.  Shower daily.  Reasons to call the Doctor:  Fever - Oral temperature greater than 100.4 degrees Fahrenheit  Foul-smelling vaginal discharge  Difficulty urinating  Nausea and vomiting  Increased pain at the site of the incision that is unrelieved with pain medicine.  Difficulty breathing with or without chest pain  New calf pain especially if only on one side  Sudden, continuing increased vaginal bleeding with or without clots.   Contacts: For questions or  concerns you should contact:  Dr. Everitt Amber or Dr. [name] at [phone]  Joylene John, NP at [phone]  After Hours: call [phone] and have the GYN Oncologist paged/contacted

## 2018-08-20 NOTE — H&P (View-Only) (Signed)
Follow-up Note: Gyn-Onc  Consult was initially requested by Dr. Carmin Muskrat for the evaluation of Lindsey Ortega 58 y.o. female  CC:  Chief Complaint  Patient presents with  . Right ovarian cyst    Assessment/Plan:  Lindsey Ortega  is a 58 y.o.  year old with a 5.6cm cystic and solid right ovarian mass and normal CA 125  After reviewing the CT images, I have a low suspicion for malignancy, though with the solid component within the ovary it is recommended that it be removed for pathologic evaluation. .  I am recommending robotic assisted RSO, with possible hysterectomy/BSO/staging if malignancy is identified. I discussed the surgical procedure and plan with the patient. I discussed anticipated postoperative outpatient stay and recovery. I discussed surgical risk including  bleeding, infection, damage to internal organs (such as bladder,ureters, bowels), blood clot, reoperation and rehospitalization.  She is in agreement to proceed with surgery as planned. To expedite surgery she will have the surgery performed with my partner, Dr [name].   HPI: Lindsey Ortega is a 58 year old P3 who was seen in consultation at the request of Dr. Carmin Muskrat from the emergency department at [facility] for a right ovarian cyst.  The patient reports symptoms of intermittent nausea and dizziness for approximately 1 month.  She was seen in the ED at the [facility] where a CT scan of the abdomen and pelvis was performed on May 29, 2018 and this revealed a 5.6 x 5.5 x 5 4.5 cm cystic and solid mass in the right adnexal space displacing the uterus to the left.  There was no ascites carcinomatosis or lymphadenopathy.  The patient's past surgical history is unremarkable with the exception of eye surgery.  She underwent menopause approximately 1 year ago and has had no postmenopausal bleeding.  Her past medical history is insignificant however the patient does not have a  primary care doctor and does not seek medical help usually and therefore it is unclear what underlying health issues she has.  Her gynecologic obstetric history significant for 2 prior vaginal deliveries and 1 cesarean section.  She denies ever having an abnormal Pap smear, though is unclear if she has had much cervical cancer screening in her life.  Her family history is unremarkable and she has no gynecologic or breast malignancies or colon cancer in her family.  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  Interval Hx:  CA 125 was assessed on 06/04/18 and was normal at 14.7.  Her surgical plan was delayed due to the COVID-19 crisis, lack of OR availability for elective cases, and a low suspicion for malignancy in an asymptomatic mass.  A repeat US was performed on 05/29/18 which showed a normal appearing uterus with 30mm endometrium. The right ovary measured 5.3x4.4x5.6cm with a solid and cystic mass. The left ovary was not seen.  No free fluid was seen.   Current Meds:  Outpatient Encounter Medications as of 08/20/2018  Medication Sig  . [DISCONTINUED] ibuprofen (ADVIL,MOTRIN) 600 MG tablet Take 1 tablet (600 mg total) by mouth every 6 (six) hours as needed.  . [DISCONTINUED] metroNIDAZOLE (FLAGYL) 500 MG tablet Take 1 tablet (500 mg total) by mouth 2 (two) times daily. (Patient not taking: Reported on 06/04/2018)  . [DISCONTINUED] ondansetron (ZOFRAN ODT) 4 MG disintegrating tablet Take 1 tablet (4 mg total) by mouth every 8 (eight) hours as needed for nausea or vomiting.   No  facility-administered encounter medications on file as of 08/20/2018.     Allergy:  Allergies  Allergen Reactions  . Percocet [Oxycodone-Acetaminophen] Nausea And Vomiting    Patient doesn't recall this ??  . Latex Rash    NO POWDERED GLOVES!!    Social Hx:   Social History   Socioeconomic History  . Marital status: Single    Spouse name: Not on file  . Number of  children: Not on file  . Years of education: Not on file  . Highest education level: Not on file  Occupational History  . Not on file  Social Needs  . Financial resource strain: Not on file  . Food insecurity:    Worry: Not on file    Inability: Not on file  . Transportation needs:    Medical: Not on file    Non-medical: Not on file  Tobacco Use  . Smoking status: Never Smoker  . Smokeless tobacco: Never Used  Substance and Sexual Activity  . Alcohol use: Yes    Comment: occasional   . Drug use: Never  . Sexual activity: Not Currently  Lifestyle  . Physical activity:    Days per week: Not on file    Minutes per session: Not on file  . Stress: Not on file  Relationships  . Social connections:    Talks on phone: Not on file    Gets together: Not on file    Attends religious service: Not on file    Active member of club or organization: Not on file    Attends meetings of clubs or organizations: Not on file    Relationship status: Not on file  . Intimate partner violence:    Fear of current or ex partner: Not on file    Emotionally abused: Not on file    Physically abused: Not on file    Forced sexual activity: Not on file  Other Topics Concern  . Not on file  Social History Narrative  . Not on file    Past Surgical Hx: History reviewed. No pertinent surgical history.  Past Medical Hx: History reviewed. No pertinent past medical history.  Past Gynecological History:  See HPI Patient's last menstrual period was 09/03/2012.  Family Hx:  Family History  Problem Relation Age of Onset  . Healthy Mother   . Healthy Father     Review of Systems:  Constitutional  Feels well,    ENT Normal appearing ears and nares bilaterally Skin/Breast  No rash, sores, jaundice, itching, dryness Cardiovascular  No chest pain, shortness of breath, or edema  Pulmonary  No cough or wheeze.  Gastro Intestinal  + nausea Genito Urinary  No frequency, urgency, dysuria, no  bleeding Musculo Skeletal  No myalgia, arthralgia, joint swelling or pain  Neurologic  dizziness  Psychology  No depression, anxiety, insomnia.   Vitals:  Blood pressure (!) 139/98, pulse 83, temperature 99.5 F (37.5 C), temperature source Oral, resp. rate 18, height 5' 5.5" (1.664 m), weight 143 lb (64.9 kg), last menstrual period 09/03/2012, SpO2 100 %.  Physical Exam: WD in NAD Neck  Supple NROM, without any enlargements.  Lymph Node Survey No cervical supraclavicular or inguinal adenopathy Cardiovascular  Pulse normal rate, regularity and rhythm. S1 and S2 normal.  Lungs  Clear to auscultation bilateraly, without wheezes/crackles/rhonchi. Good air movement.  Skin  No rash/lesions/breakdown  Psychiatry  Alert and oriented to person, place, and time  Abdomen  Normoactive bowel sounds, abdomen soft, non-tender and obese without evidence  of hernia.  Back No CVA tenderness Genito Urinary  Vulva/vagina: Normal external female genitalia.  No lesions. No discharge or bleeding.  Bladder/urethra:  No lesions or masses, well supported bladder  Vagina: normal  Cervix: Normal appearing, no lesions.  Uterus:  Small, mobile, no parametrial involvement or nodularity.  Adnexa: no palpable masses. Rectal  Good tone, no masses no cul de sac nodularity.  Extremities  No bilateral cyanosis, clubbing or edema.   Thereasa Solo, MD  08/20/2018, 12:37 PM

## 2018-08-21 ENCOUNTER — Other Ambulatory Visit: Payer: Self-pay | Admitting: Gynecologic Oncology

## 2018-08-21 DIAGNOSIS — N83201 Unspecified ovarian cyst, right side: Secondary | ICD-10-CM

## 2018-09-04 ENCOUNTER — Telehealth: Payer: Self-pay | Admitting: Gynecologic Oncology

## 2018-09-04 NOTE — Progress Notes (Signed)
05-29-18 (Epic) EKG, CXR

## 2018-09-04 NOTE — Telephone Encounter (Signed)
Called patient to make sure she did not have any questions about her upcoming surgery on Tuesday.  She states she has her pre-op appt tomorrow.  Advised they will discuss everything in detail.  She states she needs to know the times so she can arrange a ride.  All questions answered. Advised her to call for any questions or concerns.

## 2018-09-04 NOTE — Patient Instructions (Addendum)
Lindsey Ortega    Your procedure is scheduled on: 09-09-2018   Report to [facility] Main  Entrance    Report to admitting at 800 AM   Lindsey Ortega 20 TEST ON 09-05-18 @ 10:05AM_______, THIS TEST MUST BE DONE BEFORE SURGERY, COME TO Lindsey Ortega. ONCE YOUR COVID TEST IS COMPLETED, PLEASE BEGIN THE QUARANTINE INSTRUCTIONS AS OUTLINED IN YOUR HANDOUT.   Call this number if you have problems the morning of surgery [phone]    Remember: Do not eat food :After Midnight CLEAR LIQUIDS FROM MIDNIGHT UNTIL 700 AM. NOTHING BY MOUTH AFTER 700 AM   CLEAR LIQUID DIET   Foods Allowed                                                                     Foods Excluded  Coffee and tea, regular and decaf                             liquids that you cannot  Plain Jell-O in any flavor                                             see through such as: Fruit ices (not with fruit pulp)                                     milk, soups, orange juice  Iced Popsicles                                    All solid food                                    Cranberry, grape and apple juices Sports drinks like Gatorade Lightly seasoned clear broth or consume(fat free) Sugar, honey syrup  Sample Menu Breakfast                                Lunch                                     Supper Cranberry juice                    Beef broth                            Chicken broth Jell-O  Grape juice                           Apple juice Coffee or tea                        Jell-O                                      Popsicle                                                Coffee or tea                        Coffee or tea  _____________________________________________________________________    Eat a light diet the day before surgery.  Examples including soups, broths, toast, yogurt, mashed potatoes.  Things to  avoid include carbonated beverages (fizzy beverages), raw fruits and raw vegetables, or beans.   If your bowels are filled with gas, your surgeon will have difficulty visualizing your pelvic organs which increases your surgical  risks  BRUSH YOUR TEETH MORNING OF SURGERY AND RINSE YOUR MOUTH OUT, NO CHEWING GUM CANDY OR MINTS.      Take these medicines the morning of surgery with A SIP OF WATER: NONE             You may not have any metal on your body including hair pins and              piercings     Do not wear jewelry, make-up, lotions, powders or perfumes, deodorant             Do not wear nail polish.  Do not shave  48 hours prior to surgery.                 Do not bring valuables to the hospital. [name]  Contacts, dentures or bridgework may not be worn into surgery.       Patients discharged the day of surgery will not be allowed to drive home. IF YOU ARE HAVING SURGERY AND GOING HOME THE SAME DAY, YOU MUST HAVE AN ADULT TO DRIVE YOU HOME AND BE WITH YOU FOR 24 HOURS. YOU MAY GO HOME BY TAXI OR UBER OR ORTHERWISE, BUT AN ADULT MUST ACCOMPANY YOU HOME AND STAY WITH YOU FOR 24 HOURS.  Name and phone number of your driver: Lindsey Ortega [phone]  Special Instructions: N/A              Please read over the following fact sheets you were given: _____________________________________________________________________             [facility] - Preparing for Surgery Before surgery, you can play an important role.  Because skin is not sterile, your skin needs to be as free of germs as possible.  You can reduce the number of germs on your skin by washing with CHG (chlorahexidine gluconate) soap before surgery.  CHG is an antiseptic cleaner which kills germs and bonds with the skin to continue killing germs even after washing. Please DO  NOT use if you have an allergy to CHG or antibacterial soaps.  If your skin becomes  reddened/irritated stop using the CHG and inform your nurse when you arrive at Short Stay. Do not shave (including legs and underarms) for at least 48 hours prior to the first CHG shower.  You may shave your face/neck. Please follow these instructions carefully:  1.  Shower with CHG Soap the night before surgery and the  morning of Surgery.  2.  If you choose to wash your hair, wash your hair first as usual with your  normal  shampoo.  3.  After you shampoo, rinse your hair and body thoroughly to remove the  shampoo.                           4.  Use CHG as you would any other liquid soap.  You can apply chg directly  to the skin and wash                       Gently with a scrungie or clean washcloth.  5.  Apply the CHG Soap to your body ONLY FROM THE NECK DOWN.   Do not use on face/ open                           Wound or open sores. Avoid contact with eyes, ears mouth and genitals (private parts).                       Wash face,  Genitals (private parts) with your normal soap.             6.  Wash thoroughly, paying special attention to the area where your surgery  will be performed.  7.  Thoroughly rinse your body with warm water from the neck down.  8.  DO NOT shower/wash with your normal soap after using and rinsing off  the CHG Soap.                9.  Pat yourself dry with a clean towel.            10.  Wear clean pajamas.            11.  Place clean sheets on your bed the night of your first shower and do not  sleep with pets. Day of Surgery : Do not apply any lotions/deodorants the morning of surgery.  Please wear clean clothes to the hospital/surgery center.  FAILURE TO FOLLOW THESE INSTRUCTIONS MAY RESULT IN THE CANCELLATION OF YOUR SURGERY PATIENT SIGNATURE_________________________________  NURSE SIGNATURE__________________________________  ________________________________________________________________________   Lindsey Ortega  An incentive spirometer is a tool that  can help keep your lungs clear and active. This tool measures how well you are filling your lungs with each breath. Taking long deep breaths may help reverse or decrease the chance of developing breathing (pulmonary) problems (especially infection) following:  A long period of time when you are unable to move or be active. BEFORE THE PROCEDURE   If the spirometer includes an indicator to show your best effort, your nurse or respiratory therapist will set it to a desired goal.  If possible, sit up straight or lean slightly forward. Try not to slouch.  Hold the incentive spirometer in an upright position. INSTRUCTIONS FOR USE  1. Sit on the edge of your bed if  possible, or sit up as far as you can in bed or on a chair. 2. Hold the incentive spirometer in an upright position. 3. Breathe out normally. 4. Place the mouthpiece in your mouth and seal your lips tightly around it. 5. Breathe in slowly and as deeply as possible, raising the piston or the ball toward the top of the column. 6. Hold your breath for 3-5 seconds or for as long as possible. Allow the piston or ball to fall to the bottom of the column. 7. Remove the mouthpiece from your mouth and breathe out normally. 8. Rest for a few seconds and repeat Steps 1 through 7 at least 10 times every 1-2 hours when you are awake. Take your time and take a few normal breaths between deep breaths. 9. The spirometer may include an indicator to show your best effort. Use the indicator as a goal to work toward during each repetition. 10. After each set of 10 deep breaths, practice coughing to be sure your lungs are clear. If you have an incision (the cut made at the time of surgery), support your incision when coughing by placing a pillow or rolled up towels firmly against it. Once you are able to get out of bed, walk around indoors and cough well. You may stop using the incentive spirometer when instructed by your caregiver.  RISKS AND  COMPLICATIONS  Take your time so you do not get dizzy or light-headed.  If you are in pain, you may need to take or ask for pain medication before doing incentive spirometry. It is harder to take a deep breath if you are having pain. AFTER USE  Rest and breathe slowly and easily.  It can be helpful to keep track of a log of your progress. Your caregiver can provide you with a simple table to help with this. If you are using the spirometer at home, follow these instructions: [place] IF:   You are having difficultly using the spirometer.  You have trouble using the spirometer as often as instructed.  Your pain medication is not giving enough relief while using the spirometer.  You develop fever of 100.5 F (38.1 C) or higher. SEEK IMMEDIATE MEDICAL CARE IF:   You cough up bloody sputum that had not been present before.  You develop fever of 102 F (38.9 C) or greater.  You develop worsening pain at or near the incision site. MAKE SURE YOU:   Understand these instructions.  Will watch your condition.  Will get help right away if you are not doing well or get worse. Document Released: 07/16/2006 Document Revised: 05/28/2011 Document Reviewed: 09/16/2006 Exit[employer] Patient Information 2014 Exit[employer], [place].   ________________________________________________________________________  WHAT IS A BLOOD TRANSFUSION? Blood Transfusion Information  A transfusion is the replacement of blood or some of its parts. Blood is made up of multiple cells which provide different functions.  Red blood cells carry oxygen and are used for blood loss replacement.  White blood cells fight against infection.  Platelets control bleeding.  Plasma helps clot blood.  Other blood products are available for specialized needs, such as hemophilia or other clotting disorders. BEFORE THE TRANSFUSION  Who gives blood for transfusions?   Healthy volunteers who are fully evaluated to make sure  their blood is safe. This is blood bank blood. Transfusion therapy is the safest it has ever been in the practice of medicine. Before blood is taken from a donor, a complete history is taken to make sure  that person has no history of diseases nor engages in risky social behavior (examples are intravenous drug use or sexual activity with multiple partners). The donor's travel history is screened to minimize risk of transmitting infections, such as malaria. The donated blood is tested for signs of infectious diseases, such as HIV and hepatitis. The blood is then tested to be sure it is compatible with you in order to minimize the chance of a transfusion reaction. If you or a relative donates blood, this is often done in anticipation of surgery and is not appropriate for emergency situations. It takes many days to process the donated blood. RISKS AND COMPLICATIONS Although transfusion therapy is very safe and saves many lives, the main dangers of transfusion include:   Getting an infectious disease.  Developing a transfusion reaction. This is an allergic reaction to something in the blood you were given. Every precaution is taken to prevent this. The decision to have a blood transfusion has been considered carefully by your caregiver before blood is given. Blood is not given unless the benefits outweigh the risks. AFTER THE TRANSFUSION  Right after receiving a blood transfusion, you will usually feel much better and more energetic. This is especially true if your red blood cells have gotten low (anemic). The transfusion raises the level of the red blood cells which carry oxygen, and this usually causes an energy increase.  The nurse administering the transfusion will monitor you carefully for complications. HOME CARE INSTRUCTIONS  No special instructions are needed after a transfusion. You may find your energy is better. Speak with your caregiver about any limitations on activity for underlying diseases  you may have. SEEK MEDICAL CARE IF:   Your condition is not improving after your transfusion.  You develop redness or irritation at the intravenous (IV) site. SEEK IMMEDIATE MEDICAL CARE IF:  Any of the following symptoms occur over the next 12 hours:  Shaking chills.  You have a temperature by mouth above 102 F (38.9 C), not controlled by medicine.  Chest, back, or muscle pain.  People around you feel you are not acting correctly or are confused.  Shortness of breath or difficulty breathing.  Dizziness and fainting.  You get a rash or develop hives.  You have a decrease in urine output.  Your urine turns a dark color or changes to pink, red, or brown. Any of the following symptoms occur over the next 10 days:  You have a temperature by mouth above 102 F (38.9 C), not controlled by medicine.  Shortness of breath.  Weakness after normal activity.  The white part of the eye turns yellow (jaundice).  You have a decrease in the amount of urine or are urinating less often.  Your urine turns a dark color or changes to pink, red, or brown. Document Released: 03/02/2000 Document Revised: 05/28/2011 Document Reviewed: 10/20/2007 [facility] Patient Information 2014 [place], [place].  _______________________________________________________________________

## 2018-09-05 ENCOUNTER — Encounter (HOSPITAL_COMMUNITY)
Admission: RE | Admit: 2018-09-05 | Discharge: 2018-09-05 | Disposition: A | Discharge status: Home / Self Care | Payer: Self-pay | Source: Ambulatory Visit | Attending: Gynecologic Oncology | Admitting: Gynecologic Oncology

## 2018-09-05 ENCOUNTER — Encounter (HOSPITAL_COMMUNITY): Payer: Self-pay

## 2018-09-05 ENCOUNTER — Other Ambulatory Visit (HOSPITAL_COMMUNITY)
Admission: RE | Admit: 2018-09-05 | Discharge: 2018-09-05 | Disposition: A | Discharge status: Home / Self Care | Payer: Self-pay | Source: Ambulatory Visit | Attending: Gynecologic Oncology | Admitting: Gynecologic Oncology

## 2018-09-05 ENCOUNTER — Other Ambulatory Visit: Payer: Self-pay

## 2018-09-05 DIAGNOSIS — Z1159 Encounter for screening for other viral diseases: Secondary | ICD-10-CM | POA: Insufficient documentation

## 2018-09-05 DIAGNOSIS — N83201 Unspecified ovarian cyst, right side: Secondary | ICD-10-CM | POA: Insufficient documentation

## 2018-09-05 DIAGNOSIS — Z01812 Encounter for preprocedural laboratory examination: Secondary | ICD-10-CM | POA: Insufficient documentation

## 2018-09-05 HISTORY — DX: Unspecified convulsions: R56.9

## 2018-09-05 LAB — COMPREHENSIVE METABOLIC PANEL
ALT: 14 U/L (ref 0–44)
AST: 17 U/L (ref 15–41)
Albumin: 4.4 g/dL (ref 3.5–5.0)
Alkaline Phosphatase: 113 U/L (ref 38–126)
Anion gap: 9 (ref 5–15)
BUN: 11 mg/dL (ref 6–20)
CO2: 26 mmol/L (ref 22–32)
Calcium: 9.7 mg/dL (ref 8.9–10.3)
Chloride: 103 mmol/L (ref 98–111)
Creatinine, Ser: 0.72 mg/dL (ref 0.44–1.00)
GFR calc Af Amer: >60 mL/min (ref >60)
GFR calc non Af Amer: >60 mL/min (ref >60)
Glucose, Bld: 107 mg/dL — ABNORMAL HIGH (ref 70–99)
Potassium: 4 mmol/L (ref 3.5–5.1)
Sodium: 138 mmol/L (ref 135–145)
Total Bilirubin: 0.4 mg/dL (ref 0.3–1.2)
Total Protein: 8.9 g/dL — ABNORMAL HIGH (ref 6.5–8.1)

## 2018-09-05 LAB — SARS CORONAVIRUS 2 (TAT 6-24 HRS): SARS Coronavirus 2: NEGATIVE

## 2018-09-05 LAB — URINALYSIS, ROUTINE W REFLEX MICROSCOPIC
Bilirubin Urine: NEGATIVE
Glucose, UA: NEGATIVE mg/dL
Hgb urine dipstick: NEGATIVE
Ketones, ur: NEGATIVE mg/dL
Nitrite: POSITIVE — AB
Protein, ur: NEGATIVE mg/dL
Specific Gravity, Urine: 1.008 (ref 1.005–1.030)
pH: 6 (ref 5.0–8.0)

## 2018-09-05 LAB — CBC
HCT: 43.8 % (ref 36.0–46.0)
Hemoglobin: 13.7 g/dL (ref 12.0–15.0)
MCH: 29.9 pg (ref 26.0–34.0)
MCHC: 31.3 g/dL (ref 30.0–36.0)
MCV: 95.6 fL (ref 80.0–100.0)
Platelets: 352 10*3/uL (ref 150–400)
RBC: 4.58 MIL/uL (ref 3.87–5.11)
RDW: 13.4 % (ref 11.5–15.5)
WBC: 8.2 10*3/uL (ref 4.0–10.5)
nRBC: 0 % (ref 0.0–0.2)

## 2018-09-05 LAB — ABO/RH: ABO/RH(D): O POS

## 2018-09-07 LAB — URINE CULTURE: Culture: >=100000 — AB

## 2018-09-08 ENCOUNTER — Telehealth: Payer: Self-pay

## 2018-09-08 NOTE — Telephone Encounter (Signed)
Spoke with Lindsey Ortega and told her that she has a urinary tract infection from her pre op labs on 09-05-18. Verified that Lindsey Ortega was not prescribed ATB on 09-05-18. Pt states that she is out of work and cannot afford an ATB. Discussed with Dr. Alycia Rossetti. Told Lindsey Ortega that Dr. Alycia Rossetti will give her an ATB. IV to morrow for the UTI and repeat a urine culture when sh places the foley catheter. She will to have oral antibiotics post op for infection.  Lindsey Ortega was agreeable to this plan. Told her to let the nurse know tomorrow morning that she needs the least expensive ATB that is effective for the bacteria.

## 2018-09-09 ENCOUNTER — Ambulatory Visit (HOSPITAL_COMMUNITY)
Admission: RE | Admit: 2018-09-09 | Discharge: 2018-09-09 | Disposition: A | Discharge status: Home / Self Care | Payer: Self-pay | Attending: Gynecologic Oncology | Admitting: Gynecologic Oncology

## 2018-09-09 ENCOUNTER — Ambulatory Visit (HOSPITAL_COMMUNITY): Payer: Self-pay | Admitting: Certified Registered"

## 2018-09-09 ENCOUNTER — Encounter (HOSPITAL_COMMUNITY)
Admission: RE | Disposition: A | Discharge status: Home / Self Care | Payer: Self-pay | Source: Home / Self Care | Attending: Gynecologic Oncology

## 2018-09-09 ENCOUNTER — Encounter (HOSPITAL_COMMUNITY): Payer: Self-pay | Admitting: *Deleted

## 2018-09-09 ENCOUNTER — Other Ambulatory Visit: Payer: Self-pay

## 2018-09-09 ENCOUNTER — Ambulatory Visit (HOSPITAL_COMMUNITY): Payer: Self-pay | Admitting: Physician Assistant

## 2018-09-09 DIAGNOSIS — N83201 Unspecified ovarian cyst, right side: Secondary | ICD-10-CM

## 2018-09-09 DIAGNOSIS — D259 Leiomyoma of uterus, unspecified: Secondary | ICD-10-CM

## 2018-09-09 HISTORY — PX: ROBOTIC ASSISTED SALPINGO OOPHERECTOMY: SHX6082

## 2018-09-09 LAB — TYPE AND SCREEN
ABO/RH(D): O POS
Antibody Screen: NEGATIVE

## 2018-09-09 SURGERY — ROBOTIC ASSISTED SALPINGO OOPHORECTOMY
Anesthesia: General

## 2018-09-09 MED ORDER — MIDAZOLAM HCL 2 MG/2ML IJ SOLN
INTRAMUSCULAR | Status: AC
  Filled 2018-09-09: qty 2

## 2018-09-09 MED ORDER — FENTANYL CITRATE (PF) 100 MCG/2ML IJ SOLN
INTRAMUSCULAR | Status: AC
  Filled 2018-09-09: qty 2

## 2018-09-09 MED ORDER — TRAMADOL HCL 50 MG PO TABS: 50.0000 mg | ORAL_TABLET | Freq: Four times a day (QID) | ORAL | 0 refills | Status: AC | PRN

## 2018-09-09 MED ORDER — LACTATED RINGERS IV SOLN
INTRAVENOUS | Status: DC
  Administered 2018-09-09: 09:00:00 via INTRAVENOUS

## 2018-09-09 MED ORDER — LACTATED RINGERS IR SOLN
Status: DC | PRN
  Administered 2018-09-09: 1000 mL

## 2018-09-09 MED ORDER — MIDAZOLAM HCL 5 MG/5ML IJ SOLN
INTRAMUSCULAR | Status: DC | PRN
  Administered 2018-09-09: 2 mg via INTRAVENOUS

## 2018-09-09 MED ORDER — KETOROLAC TROMETHAMINE 15 MG/ML IJ SOLN: 15.0000 mg | Freq: Four times a day (QID) | INTRAMUSCULAR | Status: DC

## 2018-09-09 MED ORDER — CELECOXIB 200 MG PO CAPS
400.0000 mg | ORAL_CAPSULE | ORAL | Status: AC
  Administered 2018-09-09: 400 mg via ORAL
  Filled 2018-09-09: qty 2

## 2018-09-09 MED ORDER — METOCLOPRAMIDE HCL 5 MG/ML IJ SOLN
INTRAMUSCULAR | Status: AC
  Filled 2018-09-09: qty 2

## 2018-09-09 MED ORDER — CEFAZOLIN SODIUM-DEXTROSE 2-4 GM/100ML-% IV SOLN
2.0000 g | INTRAVENOUS | Status: AC
  Administered 2018-09-09: 2 g via INTRAVENOUS
  Filled 2018-09-09: qty 100

## 2018-09-09 MED ORDER — SUGAMMADEX SODIUM 200 MG/2ML IV SOLN
INTRAVENOUS | Status: AC
  Filled 2018-09-09: qty 2

## 2018-09-09 MED ORDER — METOCLOPRAMIDE HCL 5 MG/ML IJ SOLN
10.0000 mg | Freq: Once | INTRAMUSCULAR | Status: AC
  Administered 2018-09-09: 10 mg via INTRAVENOUS

## 2018-09-09 MED ORDER — SCOPOLAMINE 1 MG/3DAYS TD PT72
1.0000 | MEDICATED_PATCH | TRANSDERMAL | Status: DC
  Administered 2018-09-09: 1.5 mg via TRANSDERMAL
  Filled 2018-09-09: qty 1

## 2018-09-09 MED ORDER — FENTANYL CITRATE (PF) 100 MCG/2ML IJ SOLN
INTRAMUSCULAR | Status: DC | PRN
  Administered 2018-09-09: 50 ug via INTRAVENOUS
  Administered 2018-09-09: 100 ug via INTRAVENOUS

## 2018-09-09 MED ORDER — PROMETHAZINE HCL 25 MG/ML IJ SOLN: 6.2500 mg | INTRAMUSCULAR | Status: DC | PRN

## 2018-09-09 MED ORDER — TRAMADOL HCL 50 MG PO TABS: 50.0000 mg | ORAL_TABLET | Freq: Four times a day (QID) | ORAL | Status: DC | PRN

## 2018-09-09 MED ORDER — SUGAMMADEX SODIUM 200 MG/2ML IV SOLN
INTRAVENOUS | Status: DC | PRN
  Administered 2018-09-09: 120 mg via INTRAVENOUS

## 2018-09-09 MED ORDER — ROCURONIUM BROMIDE 10 MG/ML (PF) SYRINGE
PREFILLED_SYRINGE | INTRAVENOUS | Status: DC | PRN
  Administered 2018-09-09: 70 mg via INTRAVENOUS

## 2018-09-09 MED ORDER — PROPOFOL 10 MG/ML IV BOLUS
INTRAVENOUS | Status: AC
  Filled 2018-09-09: qty 20

## 2018-09-09 MED ORDER — DEXAMETHASONE SODIUM PHOSPHATE 4 MG/ML IJ SOLN
4.0000 mg | INTRAMUSCULAR | Status: AC
  Administered 2018-09-09: 4 mg via INTRAVENOUS

## 2018-09-09 MED ORDER — SODIUM CHLORIDE 0.9% FLUSH: 3.0000 mL | Freq: Two times a day (BID) | INTRAVENOUS | Status: DC

## 2018-09-09 MED ORDER — MEPERIDINE HCL 50 MG/ML IJ SOLN: 6.2500 mg | INTRAMUSCULAR | Status: DC | PRN

## 2018-09-09 MED ORDER — HYDROMORPHONE HCL 1 MG/ML IJ SOLN
0.2500 mg | INTRAMUSCULAR | Status: DC | PRN
  Administered 2018-09-09: 0.5 mg via INTRAVENOUS

## 2018-09-09 MED ORDER — PHENYLEPHRINE 40 MCG/ML (10ML) SYRINGE FOR IV PUSH (FOR BLOOD PRESSURE SUPPORT)
PREFILLED_SYRINGE | INTRAVENOUS | Status: DC | PRN
  Administered 2018-09-09: 80 ug via INTRAVENOUS
  Administered 2018-09-09: 40 ug via INTRAVENOUS
  Administered 2018-09-09: 80 ug via INTRAVENOUS

## 2018-09-09 MED ORDER — GABAPENTIN 300 MG PO CAPS
300.0000 mg | ORAL_CAPSULE | ORAL | Status: AC
  Administered 2018-09-09: 300 mg via ORAL
  Filled 2018-09-09: qty 1

## 2018-09-09 MED ORDER — LIDOCAINE 2% (20 MG/ML) 5 ML SYRINGE
INTRAMUSCULAR | Status: DC | PRN
  Administered 2018-09-09: 50 mg via INTRAVENOUS

## 2018-09-09 MED ORDER — LABETALOL HCL 5 MG/ML IV SOLN
INTRAVENOUS | Status: DC | PRN
  Administered 2018-09-09: 5 mg via INTRAVENOUS

## 2018-09-09 MED ORDER — ACETAMINOPHEN 500 MG PO TABS
1000.0000 mg | ORAL_TABLET | ORAL | Status: AC
  Administered 2018-09-09: 1000 mg via ORAL
  Filled 2018-09-09: qty 2

## 2018-09-09 MED ORDER — PROPOFOL 10 MG/ML IV BOLUS
INTRAVENOUS | Status: DC | PRN
  Administered 2018-09-09: 100 mg via INTRAVENOUS
  Administered 2018-09-09: 150 mg via INTRAVENOUS

## 2018-09-09 MED ORDER — LABETALOL HCL 5 MG/ML IV SOLN
INTRAVENOUS | Status: AC
  Filled 2018-09-09: qty 4

## 2018-09-09 MED ORDER — STERILE WATER FOR IRRIGATION IR SOLN
Status: DC | PRN
  Administered 2018-09-09: 1000 mL

## 2018-09-09 MED ORDER — ONDANSETRON HCL 4 MG/2ML IJ SOLN
INTRAMUSCULAR | Status: DC | PRN
  Administered 2018-09-09: 4 mg via INTRAVENOUS

## 2018-09-09 MED ORDER — SULFAMETHOXAZOLE-TRIMETHOPRIM 800-160 MG PO TABS: 1.0000 | ORAL_TABLET | Freq: Two times a day (BID) | ORAL | 0 refills | Status: AC

## 2018-09-09 MED ORDER — HYDROMORPHONE HCL 1 MG/ML IJ SOLN
INTRAMUSCULAR | Status: AC
  Filled 2018-09-09: qty 1

## 2018-09-09 SURGICAL SUPPLY — 55 items
ADH SKN CLS APL DERMABOND .7 (GAUZE/BANDAGES/DRESSINGS) ×1
APL SKNCLS STERI-STRIP NONHPOA (GAUZE/BANDAGES/DRESSINGS)
BAG LAPAROSCOPIC 12 15 PORT 16 (BASKET) IMPLANT
BAG RETRIEVAL 12/15 (BASKET) ×2
BAG RETRIEVAL 12/15MM (BASKET) ×1
BAG SPEC RTRVL LRG 6X4 10 (ENDOMECHANICALS)
BENZOIN TINCTURE PRP APPL 2/3 (GAUZE/BANDAGES/DRESSINGS) IMPLANT
CLOSURE WOUND 1/2 X4 (GAUZE/BANDAGES/DRESSINGS)
COVER BACK TABLE 60X90IN (DRAPES) ×2 IMPLANT
COVER TIP SHEARS 8 DVNC (MISCELLANEOUS) ×1 IMPLANT
COVER TIP SHEARS 8MM DA VINCI (MISCELLANEOUS) ×2
COVER WAND RF STERILE (DRAPES) IMPLANT
DECANTER SPIKE VIAL GLASS SM (MISCELLANEOUS) ×1 IMPLANT
DERMABOND ADVANCED (GAUZE/BANDAGES/DRESSINGS) ×2
DERMABOND ADVANCED .7 DNX12 (GAUZE/BANDAGES/DRESSINGS) IMPLANT
DRAPE ARM DVNC X/XI (DISPOSABLE) ×4 IMPLANT
DRAPE COLUMN DVNC XI (DISPOSABLE) ×1 IMPLANT
DRAPE DA VINCI XI ARM (DISPOSABLE) ×8
DRAPE DA VINCI XI COLUMN (DISPOSABLE) ×2
DRAPE SHEET LG 3/4 BI-LAMINATE (DRAPES) ×3 IMPLANT
DRAPE SURG IRRIG POUCH 19X23 (DRAPES) ×3 IMPLANT
DRSG TEGADERM 2-3/8X2-3/4 SM (GAUZE/BANDAGES/DRESSINGS) IMPLANT
ELECT REM PT RETURN 15FT ADLT (MISCELLANEOUS) ×3 IMPLANT
GAUZE SPONGE 2X2 8PLY STRL LF (GAUZE/BANDAGES/DRESSINGS) IMPLANT
GLOVE BIO SURGEON STRL SZ 6.5 (GLOVE) ×10 IMPLANT
GLOVE BIO SURGEONS STRL SZ 6.5 (GLOVE) ×5
GLOVE BIOGEL PI IND STRL 7.0 (GLOVE) ×2 IMPLANT
GLOVE BIOGEL PI INDICATOR 7.0 (GLOVE) ×4
GOWN STRL REUS W/ TWL LRG LVL3 (GOWN DISPOSABLE) ×1 IMPLANT
GOWN STRL REUS W/ TWL XL LVL3 (GOWN DISPOSABLE) ×2 IMPLANT
GOWN STRL REUS W/TWL LRG LVL3 (GOWN DISPOSABLE) ×2
GOWN STRL REUS W/TWL XL LVL3 (GOWN DISPOSABLE) ×6
HOLDER FOLEY CATH W/STRAP (MISCELLANEOUS) ×3 IMPLANT
IRRIG SUCT STRYKERFLOW 2 WTIP (MISCELLANEOUS) ×3
IRRIGATION SUCT STRKRFLW 2 WTP (MISCELLANEOUS) ×1 IMPLANT
KIT TURNOVER KIT A (KITS) IMPLANT
MANIPULATOR UTERINE 4.5 ZUMI (MISCELLANEOUS) ×2 IMPLANT
PACK ROBOT GYN CUSTOM WL (TRAY / TRAY PROCEDURE) ×3 IMPLANT
PAD POSITIONING PINK XL (MISCELLANEOUS) ×3 IMPLANT
POUCH SPECIMEN RETRIEVAL 10MM (ENDOMECHANICALS) IMPLANT
SEAL CANN UNIV 5-8 DVNC XI (MISCELLANEOUS) ×4 IMPLANT
SEAL XI 5MM-8MM UNIVERSAL (MISCELLANEOUS) ×6
SET TRI-LUMEN FLTR TB AIRSEAL (TUBING) IMPLANT
SOLUTION ELECTROLUBE (MISCELLANEOUS) ×3 IMPLANT
SPONGE GAUZE 2X2 STER 10/PKG (GAUZE/BANDAGES/DRESSINGS)
STRIP CLOSURE SKIN 1/2X4 (GAUZE/BANDAGES/DRESSINGS) IMPLANT
SUT VIC AB 0 CT1 27 (SUTURE) ×9
SUT VIC AB 0 CT1 27XBRD ANTBC (SUTURE) ×3 IMPLANT
SUT VIC AB 3-0 SH 27 (SUTURE) ×3
SUT VIC AB 3-0 SH 27XBRD (SUTURE) IMPLANT
TOWEL OR NON WOVEN STRL DISP B (DISPOSABLE) ×3 IMPLANT
TRAP SPECIMEN MUCOUS 40CC (MISCELLANEOUS) ×1 IMPLANT
TROCAR XCEL 12X100 BLDLESS (ENDOMECHANICALS) ×3 IMPLANT
UNDERPAD 30X30 (UNDERPADS AND DIAPERS) ×3 IMPLANT
WATER STERILE IRR 1000ML POUR (IV SOLUTION) ×4 IMPLANT

## 2018-09-09 NOTE — Interval H&P Note (Signed)
History and Physical Interval Note:  09/09/2018 9:24 AM  Lindsey Ortega  has presented today for surgery, with the diagnosis of RIGHT OVARIAN CYST.  The various methods of treatment have been discussed with the patient and family. After consideration of risks, benefits and other options for treatment, the patient has consented to  Procedure(s): ROBOTIC ASSISTED SALPINGO OOPHORECTOMY POSSIBLE TOTAL LAPRASCOPIC HYSTERECTOMY AND POSSIBLE BILATERAL OOPHERECTOMY AND POSSIBLE STAGING (N/A) as a surgical intervention.  The patient's history has been reviewed, patient examined, no change in status, stable for surgery.  I have reviewed the patient's chart and labs.  Questions were answered to the patient's satisfaction.  We discussed the procedure and she wishes to proceed with left salpingectomy.    [place] A.

## 2018-09-09 NOTE — Anesthesia Postprocedure Evaluation (Signed)
Anesthesia Post Note  Patient: ANNIKAH LOVINS  Procedure(s) Performed: ROBOTIC ASSISTED RIGHT SALPINGO-OOPHORECTOMY, LEFT SALPINGECTOMY, MYOMECTOMY (N/A )     Patient location during evaluation: PACU Anesthesia Type: General Level of consciousness: awake and alert Pain management: pain level controlled Vital Signs Assessment: post-procedure vital signs reviewed and stable Respiratory status: spontaneous breathing, nonlabored ventilation and respiratory function stable Cardiovascular status: blood pressure returned to baseline and stable Postop Assessment: no apparent nausea or vomiting Anesthetic complications: no    Last Vitals:  Vitals:   09/09/18 1315 09/09/18 1330  BP: (!) 159/88 (!) 166/83  Pulse: 80 87  Resp: 15 16  Temp:    SpO2: 95% 98%    Last Pain:  Vitals:   09/09/18 1330  TempSrc:   PainSc: 0-No pain                 Lynda Rainwater

## 2018-09-09 NOTE — Op Note (Signed)
PATIENT: Lindsey Ortega DATE OF BIRTH: Dec 26, 1960 ENCOUNTER DATE: 09/09/18   Preop Diagnosis: Right adnexal mass  Postoperative Diagnosis: Broad ligament myoma  Surgery: Robotic myomectomy, right salpingo-oophorectomy, left salpingectomy  Surgeons:  Imagene Gurney A. Alycia Rossetti, MD; Lahoma Crocker, MD   Anesthesia: General   Estimated blood loss: 20 ml   IVF: 800 ml   Urine output: 50  ml   Complications: None    Pathology: Right fallopian tube and ovary, right sided broad ligament myoma, left fallopian tube  Operative findings: Normal abdominal pelvic survey.  Small omental attachment to the anterior abdominal wall.  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  Unremarkable uterus and left adnexa otherwise.  Procedure: The patient was identified in the preoperative holding area. Informed consent was signed on the chart. Patient was seen history was reviewed and exam was performed.   The patient was then taken to the operating room and placed in the supine position with SCD hose on. She was then placed in the dorsolithotomy position. Her arms were tucked at her side with appropriate precautions on the gel pad. General anesthesia was then induced without difficulty. Shoulder blocks were then placed in the usual fashion with appropriate precautions. A OG-tube was placed to suction. First timeout was performed to confirm the patient, procedure, antibiotic, allergy status, estimated blood loss and OR time. The perineum was then prepped in the usual fashion with Betadine. A 14 French Foley was inserted into the bladder under sterile conditions. A sterile speculum was placed in the vagina. The cervix was without lesions. The cervix was grasped with a single-tooth tenaculum. The dilator without difficulty. A ZUMI with a small Koe ring was placed without difficulty. The abdomen was then prepped with 2 Chlor prep sponges per  protocol.   Patient was then draped after the prep was dried. Second timeout was performed to confirm the above. After again confirming OG tube placement and it was to suction. A stab-wound was made in left upper quadrant 2 cm below the costal margin on the left in the midclavicular line. A 5 mm operative report was used to assure intra-abdominal placement. The abdomen was insufflated. At this point all points during the procedure the patient's intra-abdominal pressure was not increased over 15 mm of mercury. After insufflation was complete, the patient was placed in deep Trendelenburg position. 20 cm above the pubic symphysis that area was marked the camera port. Bilateral robotic ports were marked 10 cm from the midline incision and the 4th arm was placed in the right lower quadrant. Under direct visualization each of the trochars was placed into the abdomen. The small bowel was folded on its mesentery to allow visualization to the pelvis. The 5 mm LUQ port was then converted to a 10/12 port under direct visualization.  After assuring adequate visualization, the robot was then docked in the usual fashion. Under direct visualization the robotic instruments replaced.   Our attention was drawn to the patient's left side.  A salpingectomy was performed in the usual fashion with pinpoint cautery and the mesosalpinx to separate the fallopian tube from the IP.  It was delivered through the 10/12 port.  Our attention was then drawn to the large myoma.  The posterior leaf the broad ligament on the patient's right side was opened from the round ligament to the pelvic brim.  The ureter was identified.  With gentle traction we were able to free the myoma  from the adventitial attachments on the medial leaf of the broad ligament.  The ureter was identified.  It became clear that the myoma was intimately involved with the utero-ovarian and in order to proceed with a myomectomy, that we would need to proceed with a right  salpingo-oophorectomy.  A window was made between the blood supply and the ureter through the peritoneum.  The IP ligament was coagulated and transected.  We continued freeing up the adventitial tissue to elevate to the large proximate myoma.  The ureter was falling inferior with its peritoneal attachments.  Using bipolar cautery the utero-ovarian on the right side was coagulated and cauterized.  We continued down the side of the uterus creating a plane between the fibroid and the uterus.  We reached the level of the uterine arteries were able to dissect the fibroid free of the uterine arteries with the ureter coursing inferiorly.  At this point were able to bipolar the last ligamentous attachment of the fibroid with its blood supply and the fibroid was freed.  An Endo Catch bag was placed through the left upper quadrant port and the myoma, right fallopian tube and ovary were placed in the Endo Catch bag.  The pelvis was inspected and all pedicles were noted to be hemostatic.  At this point the robot was undocked.  The patient was taken out of the Trendelenburg position.  The left upper quadrant incision was extended another centimeter as was its fascia.  Using the Endo Catch bag we were able to morcellate the fibroid in a contained fashion.  It was removed without difficulty.  All specimens were sent for permanent sectioning.  The fascia of the left upper quadrant port was closed with a 0 Vicryl on a UR 6 suture.  The subcu tissues were reapproximated with a 3-0 Vicryl.  The skin was closed using 4-0 Vicryl.  Dermabond was placed.  The patient tolerated the procedure well.  The Koh ring, ZUMI and pneumo-occluder balloon was removed as was a Foley.  All instrument, Ray-Tec and needle counts were correct x2. The patient tolerated the procedure well and was taken to the recovery room in stable condition. This is Lindsey Ortega dictating an operative note on patient Lindsey Ortega.

## 2018-09-09 NOTE — Discharge Instructions (Signed)
Myomectomy, Care After °This sheet gives you information about how to care for yourself after your procedure. Your health care provider may also give you more specific instructions. If you have problems or questions, contact your health care provider. °What can I expect after the procedure? °After the procedure, it is common to have: °· Pain in your abdomen, especially at the incision areas. You will be given pain medicine to control the pain. °· Tiredness. This is a normal part of the recovery process. Your energy level will return to normal over the coming weeks. °· Vaginal bleeding. This is normal and will stop in the coming weeks. °· Constipation. °Recovery time from this procedure will depend on the type of procedure you had and your general overall health prior to the procedure. °Follow these instructions at home: °Medicines °· Take over-the-counter and prescription medicines only as told by your health care provider. °· Do not take aspirin because it can cause bleeding. °· If you were prescribed an antibiotic medicine, use it as told by your health care provider. Do not stop using the antibiotic even if you start to feel better. °· Do not drive or use heavy machinery while taking prescription pain medicine. °· Do not drink alcohol while taking prescription pain medicine. °Incision care ° °· Follow instructions from your health care provider about how to take care of any incisions. Make sure you: °? Wash your hands with soap and water before you change your bandage (dressing). If soap and water are not available, use hand sanitizer. °? Change your dressing as told by your health care provider. °? Leave stitches (sutures), skin glue, or adhesive strips in place. These skin closures may need to stay in place for 2 weeks or longer. If adhesive strip edges start to loosen and curl up, you may trim the loose edges. Do not remove adhesive strips completely unless your health care provider tells you to do  that. °· Check your incision areas every day for signs of infection. Check for: °? Redness, swelling, or pain. °? Fluid or blood. °? Warmth. °? Pus or a bad smell. °· Do not take baths, swim, or use a hot tub until your health care provider approves. Take showers as directed by your health care provider. °Activity °· Return to your normal activities as told by your health care provider. Ask your health care provider what activities are safe for you. °· Do not do activities that require a lot of effort until your health care provider says it is okay. °· Do not lift anything that is heavier than 15 lb (6.8 kg) until your health care provider says that it is safe. °· Do not douche, use tampons, or have sexual intercourse until your health care provider approves. °· Walk daily but take frequent rest breaks if you tire easily. °· Continue to practice deep breathing and coughing. If it hurts to cough, try holding a pillow against your belly as you cough. °· Do not drive until your health care provider approves. °General instructions °· To prevent or treat constipation while you are taking prescription pain medicine, your health care provider may recommend that you: °? Drink enough fluid to keep your urine clear or pale yellow. °? Take over-the-counter or prescription medicines. °? Eat foods that are high in fiber, such as fresh fruits and vegetables, whole grains, and beans. °? Limit foods that are high in fat and processed sugars, such as fried and sweet foods. °· Take your temperature twice a day   and write it down. If you develop a fever, this may be a sign that you have an infection. °· Do not drink alcohol. °· Have someone help you at home for 1 week or until you can do your own household activities. °· Keep all follow-up visits as told by your health care provider. This is important. °Contact a health care provider if: °· You have a fever. °· You have increasing abdominal pain that is not relieved with  medicine. °· You have nausea, vomiting, or diarrhea. °· You have pain when you urinate or you have blood in your urine. °· You have a rash on your body. °· You have pain or redness where your IV access tube was inserted. °· You have redness, swelling, or pain around an incision. °· You have fluid or blood coming from an incision. °· An incision feels warm to the touch. °· You have pus or a bad smell coming from an incision. °Get help right away if: °· You have weakness or light-headedness. °· You have pain, swelling, or redness in your legs. °· You have chest pain. °· You faint. °· You have shortness of breath. °· You have heavy vaginal bleeding. °· You have an incision that is opening up. °Summary °· Recovery time from this procedure will depend on the type of procedure you had and your general overall health prior to the procedure. °· If you were prescribed an antibiotic medicine, use it as told by your health care provider. Do not stop using the antibiotic even if you start to feel better. °· Do not douche, use tampons, or have sexual intercourse until your health care provider approves. °· Return to your normal activities as told by your health care provider. Ask your health care provider what activities are safe for you. °This information is not intended to replace advice given to you by your health care provider. Make sure you discuss any questions you have with your health care provider. °Document Released: 07/26/2010 Document Revised: 04/05/2016 Document Reviewed: 04/05/2016 °Elsevier Interactive Patient Education © 2019 [employer] ° °

## 2018-09-09 NOTE — Anesthesia Preprocedure Evaluation (Addendum)
Anesthesia Evaluation  Patient identified by MRN, date of birth, ID band Patient awake    Reviewed: Allergy & Precautions, NPO status , Patient's Chart, lab work & pertinent test results  Airway Mallampati: II  TM Distance: >3 FB Neck ROM: Full    Dental  (+) Missing, Upper Dentures   Pulmonary neg pulmonary ROS,    Pulmonary exam normal breath sounds clear to auscultation       Cardiovascular negative cardio ROS Normal cardiovascular exam Rhythm:Regular Rate:Normal     Neuro/Psych negative neurological ROS  negative psych ROS   GI/Hepatic negative GI ROS, Neg liver ROS,   Endo/Other  negative endocrine ROS  Renal/GU negative Renal ROS  negative genitourinary   Musculoskeletal negative musculoskeletal ROS (+)   Abdominal   Peds negative pediatric ROS (+)  Hematology negative hematology ROS (+)   Anesthesia Other Findings Ovarian cyst  Reproductive/Obstetrics negative OB ROS                            Anesthesia Physical Anesthesia Plan  ASA: II  Anesthesia Plan: General   Post-op Pain Management:    Induction: Intravenous  PONV Risk Score and Plan: 3 and Ondansetron, Dexamethasone and Midazolam  Airway Management Planned: Oral ETT  Additional Equipment:   Intra-op Plan:   Post-operative Plan: Extubation in OR  Informed Consent: I have reviewed the patients History and Physical, chart, labs and discussed the procedure including the risks, benefits and alternatives for the proposed anesthesia with the patient or authorized representative who has indicated his/her understanding and acceptance.     Dental advisory given  Plan Discussed with: CRNA  Anesthesia Plan Comments:         Anesthesia Quick Evaluation

## 2018-09-09 NOTE — Transfer of Care (Signed)
Immediate Anesthesia Transfer of Care Note  Patient: Lindsey Ortega  Procedure(s) Performed: ROBOTIC ASSISTED RIGHT SALPINGO-OOPHORECTOMY, LEFT SALPINGECTOMY, MYOMECTOMY (N/A )  Patient Location: PACU  Anesthesia Type:General  Level of Consciousness: awake, sedated and patient cooperative  Airway & Oxygen Therapy: Patient connected to face mask oxygen  Post-op Assessment: Report given to RN and Post -op Vital signs reviewed and stable  Post vital signs: stable  Last Vitals:  Vitals Value Taken Time  BP 149/89 09/09/18 1155  Temp    Pulse 74 09/09/18 1157  Resp 20 09/09/18 1157  SpO2 99 % 09/09/18 1157  Vitals shown include unvalidated device data.  Last Pain:  Vitals:   09/09/18 0820  TempSrc:   PainSc: 0-No pain         Complications: No apparent anesthesia complications

## 2018-09-09 NOTE — Anesthesia Procedure Notes (Signed)
Procedure Name: Intubation Date/Time: 09/09/2018 10:12 AM Performed by: Pilar Grammes, CRNA Pre-anesthesia Checklist: Patient identified, Emergency Drugs available, Suction available, Patient being monitored and Timeout performed Patient Re-evaluated:Patient Re-evaluated prior to induction Oxygen Delivery Method: Circle system utilized Preoxygenation: Pre-oxygenation with 100% oxygen Induction Type: IV induction Ventilation: Mask ventilation without difficulty Laryngoscope Size: Miller and 2 Grade View: Grade I Tube type: Oral Tube size: 7.5 mm Number of attempts: 2 Airway Equipment and Method: Stylet Placement Confirmation: positive ETCO2,  ETT inserted through vocal cords under direct vision,  CO2 detector and breath sounds checked- equal and bilateral Secured at: 22 cm Tube secured with: Tape Dental Injury: Teeth and Oropharynx as per pre-operative assessment

## 2018-09-10 ENCOUNTER — Telehealth: Payer: Self-pay

## 2018-09-10 ENCOUNTER — Encounter (HOSPITAL_COMMUNITY): Payer: Self-pay | Admitting: Gynecologic Oncology

## 2018-09-10 NOTE — Telephone Encounter (Signed)
LM for patient to call back to the office to discuss how she is doing today after her surgery yesterday 09-09-18.

## 2018-09-11 ENCOUNTER — Telehealth: Payer: Self-pay | Admitting: Oncology

## 2018-09-11 NOTE — Telephone Encounter (Signed)
Called Lindsey Ortega and advised her of pathology results from her surgery on 09/09/18 per Dr. Alycia Rossetti.  She verbalized understanding and agreement.  Also asked how she is feeling. She said she is having a lot of pain and is tired.  She is taking tylenol PM once a day.  She does have ibuprofen.  Advised her that tylenol PM will make her tired.  Told her to try the ibuprofen to see if helps and that she can take it every 6 hours as needed.  Also discussed that tramadol was sent to [facility] for pain and that she can also take that if needed.  Advised her to call back if her pain does not get better.  She verbalized understanding and agreement.

## 2018-10-13 NOTE — Progress Notes (Deleted)
Follow-up Note: Gyn-Onc  Consult was initially requested by Dr. Carmin Muskrat for the evaluation of Lindsey Ortega 58 y.o. female  CC:  No chief complaint on file.   Assessment/Plan:  Lindsey Ortega  is a 58 y.o.  year old with a 5.6cm cystic and solid right ovarian mass and normal CA 125   HPI: Lindsey Ortega is a 58 year old P3 who was seen in consultation at the request of Dr. Carmin Muskrat from the emergency department at [facility] for a right ovarian cyst.  The patient reports symptoms of intermittent nausea and dizziness for approximately 1 month.  She was seen in the ED at the [facility] where a CT scan of the abdomen and pelvis was performed on May 29, 2018 and this revealed a 5.6 x 5.5 x 5 4.5 cm cystic and solid mass in the right adnexal space displacing the uterus to the left.  There was no ascites carcinomatosis or lymphadenopathy.  The patient's past surgical history is unremarkable with the exception of eye surgery.  She underwent menopause approximately 1 year ago and has had no postmenopausal bleeding.  Her past medical history is insignificant however the patient does not have a primary care doctor and does not seek medical help usually and therefore it is unclear what underlying health issues she has.  Her gynecologic obstetric history significant for 2 prior vaginal deliveries and 1 cesarean section.  She denies ever having an abnormal Pap smear, though is unclear if she has had much cervical cancer screening in her life.  Her family history is unremarkable and she has no gynecologic or breast malignancies or colon cancer in her family.  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  Interval Hx:  CA 125 was assessed on 06/04/18 and was normal at 14.7.  Her surgical plan was delayed due to the COVID-19 crisis, lack of OR availability for elective cases, and a low suspicion for malignancy in an  asymptomatic mass.  A repeat US was performed on 05/29/18 which showed a normal appearing uterus with 56mm endometrium. The right ovary measured 5.3x4.4x5.6cm with a solid and cystic mass. The left ovary was not seen.  No free fluid was seen.   ENCOUNTER DATE: 09/09/18 Surgery: Robotic myomectomy, right salpingo-oophorectomy, left salpingectomy Pathology: Right fallopian tube and ovary, right sided broad ligament myoma, left fallopian tube Operative findings: Normal abdominal pelvic survey.  Small omental attachment to the anterior abdominal wall.  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  Unremarkable uterus and left adnexa otherwise.  Diagnosis 1. Fallopian tube, left - BENIGN FALLOPIAN TUBE. 2. Ovary and fallopian tube, right, and fibroid - BENIGN OVARY AND FALLOPIAN TUBE. - LEIOMYOMA(TA). - THERE IS NO EVIDENCE OF MALIGNANCY.  Current Meds:  No outpatient encounter medications on file as of 10/14/2018.   No facility-administered encounter medications on file as of 10/14/2018.     Allergy:  Allergies  Allergen Reactions  . Percocet [Oxycodone-Acetaminophen] Nausea And Vomiting  . Latex Rash    NO POWDERED GLOVES!!    Social Hx:   Social History   Socioeconomic History  . Marital status: Single    Spouse name: Not on file  . Number of children: Not on file  . Years of education: Not on file  . Highest education level: Not on file  Occupational History  . Not on file  Social Needs  . Financial resource strain: Not on file  . Food insecurity    Worry: Not on file    Inability: Not on file  . Transportation needs    Medical: Not on file    Non-medical: Not on file  Tobacco Use  . Smoking status: Never Smoker  . Smokeless tobacco: Never Used  Substance and Sexual Activity  . Alcohol use: Yes    Alcohol/week: 1.0 standard drinks    Types: 1 Glasses of wine per week    Comment: rare  .  Drug use: Never  . Sexual activity: Not Currently  Lifestyle  . Physical activity    Days per week: Not on file    Minutes per session: Not on file  . Stress: Not on file  Relationships  . Social Herbalist on phone: Not on file    Gets together: Not on file    Attends religious service: Not on file    Active member of club or organization: Not on file    Attends meetings of clubs or organizations: Not on file    Relationship status: Not on file  . Intimate partner violence    Fear of current or ex partner: Not on file    Emotionally abused: Not on file    Physically abused: Not on file    Forced sexual activity: Not on file  Other Topics Concern  . Not on file  Social History Narrative  . Not on file    Past Surgical Hx:  Past Surgical History:  Procedure Laterality Date  . EYE SURGERY     in childhood  . ROBOTIC ASSISTED SALPINGO OOPHERECTOMY N/A 09/09/2018   Procedure: ROBOTIC ASSISTED RIGHT SALPINGO-OOPHORECTOMY, LEFT SALPINGECTOMY, MYOMECTOMY;  Surgeon: [name], MD;  Location: [facility];  Service: Gynecology;  Laterality: N/A;    Past Medical Hx:  Past Medical History:  Diagnosis Date  . Seizures ([place])    Last seizure was during adolescent     Past Gynecological History:  See HPI Patient's last menstrual period was 09/03/2017.  Family Hx:  Family History  Problem Relation Age of Onset  . Healthy Mother   . Healthy Father     Review of Systems: Vitals:  Last menstrual period 09/03/2017.  Physical Exam: WD in NAD Abdomen:  Normoactive bowel sounds, abdomen soft, non-tender and obese without evidence of hernia.     Jakeem Grape A., MD  10/13/2018, 9:19 AM

## 2018-10-14 ENCOUNTER — Inpatient Hospital Stay: Payer: Self-pay | Attending: Gynecologic Oncology | Admitting: Gynecologic Oncology

## 2019-01-28 ENCOUNTER — Encounter (HOSPITAL_COMMUNITY): Payer: Self-pay

## 2019-01-28 ENCOUNTER — Emergency Department (HOSPITAL_COMMUNITY)
Admission: EM | Admit: 2019-01-28 | Discharge: 2019-01-28 | Disposition: A | Discharge status: Home / Self Care | Payer: Self-pay | Attending: Emergency Medicine | Admitting: Emergency Medicine

## 2019-01-28 ENCOUNTER — Other Ambulatory Visit: Payer: Self-pay

## 2019-01-28 DIAGNOSIS — R7309 Other abnormal glucose: Secondary | ICD-10-CM | POA: Insufficient documentation

## 2019-01-28 DIAGNOSIS — Z91040 Latex allergy status: Secondary | ICD-10-CM | POA: Insufficient documentation

## 2019-01-28 DIAGNOSIS — N390 Urinary tract infection, site not specified: Secondary | ICD-10-CM | POA: Insufficient documentation

## 2019-01-28 LAB — CBC WITH DIFFERENTIAL/PLATELET
Abs Immature Granulocytes: 0.02 10*3/uL (ref 0.00–0.07)
Basophils Absolute: 0 10*3/uL (ref 0.0–0.1)
Basophils Relative: 1 %
Eosinophils Absolute: 0.1 10*3/uL (ref 0.0–0.5)
Eosinophils Relative: 1 %
HCT: 41 % (ref 36.0–46.0)
Hemoglobin: 13.3 g/dL (ref 12.0–15.0)
Immature Granulocytes: 0 %
Lymphocytes Relative: 32 %
Lymphs Abs: 2 10*3/uL (ref 0.7–4.0)
MCH: 31 pg (ref 26.0–34.0)
MCHC: 32.4 g/dL (ref 30.0–36.0)
MCV: 95.6 fL (ref 80.0–100.0)
Monocytes Absolute: 0.3 10*3/uL (ref 0.1–1.0)
Monocytes Relative: 5 %
Neutro Abs: 4 10*3/uL (ref 1.7–7.7)
Neutrophils Relative %: 61 %
Platelets: 323 10*3/uL (ref 150–400)
RBC: 4.29 MIL/uL (ref 3.87–5.11)
RDW: 13.6 % (ref 11.5–15.5)
WBC: 6.5 10*3/uL (ref 4.0–10.5)
nRBC: 0 % (ref 0.0–0.2)

## 2019-01-28 LAB — URINALYSIS, ROUTINE W REFLEX MICROSCOPIC
Bilirubin Urine: NEGATIVE
Glucose, UA: NEGATIVE mg/dL
Ketones, ur: NEGATIVE mg/dL
Nitrite: POSITIVE — AB
Protein, ur: NEGATIVE mg/dL
Specific Gravity, Urine: 1.008 (ref 1.005–1.030)
pH: 7 (ref 5.0–8.0)

## 2019-01-28 LAB — COMPREHENSIVE METABOLIC PANEL
ALT: 13 U/L (ref 0–44)
AST: 19 U/L (ref 15–41)
Albumin: 4.1 g/dL (ref 3.5–5.0)
Alkaline Phosphatase: 103 U/L (ref 38–126)
Anion gap: 7 (ref 5–15)
BUN: 8 mg/dL (ref 6–20)
CO2: 26 mmol/L (ref 22–32)
Calcium: 9.5 mg/dL (ref 8.9–10.3)
Chloride: 105 mmol/L (ref 98–111)
Creatinine, Ser: 0.9 mg/dL (ref 0.44–1.00)
GFR calc Af Amer: >60 mL/min (ref >60)
GFR calc non Af Amer: >60 mL/min (ref >60)
Glucose, Bld: 103 mg/dL — ABNORMAL HIGH (ref 70–99)
Potassium: 3.5 mmol/L (ref 3.5–5.1)
Sodium: 138 mmol/L (ref 135–145)
Total Bilirubin: 0.7 mg/dL (ref 0.3–1.2)
Total Protein: 8.3 g/dL — ABNORMAL HIGH (ref 6.5–8.1)

## 2019-01-28 LAB — TSH: TSH: 0.625 u[IU]/mL (ref 0.350–4.500)

## 2019-01-28 LAB — HEMOGLOBIN A1C
Hgb A1c MFr Bld: 5.9 % — ABNORMAL HIGH (ref 4.8–5.6)
Mean Plasma Glucose: 122.63 mg/dL

## 2019-01-28 LAB — LIPASE, BLOOD: Lipase: 22 U/L (ref 11–51)

## 2019-01-28 MED ORDER — SODIUM CHLORIDE 0.9 % IV SOLN
1.0000 g | Freq: Once | INTRAVENOUS | Status: AC
  Administered 2019-01-28: 10:00:00 1 g via INTRAVENOUS
  Filled 2019-01-28: qty 10

## 2019-01-28 MED ORDER — MECLIZINE HCL 25 MG PO TABS
25.0000 mg | ORAL_TABLET | Freq: Once | ORAL | Status: AC
  Administered 2019-01-28: 25 mg via ORAL
  Filled 2019-01-28: qty 1

## 2019-01-28 MED ORDER — SODIUM CHLORIDE 0.9 % IV BOLUS
500.0000 mL | Freq: Once | INTRAVENOUS | Status: AC
  Administered 2019-01-28: 500 mL via INTRAVENOUS

## 2019-01-28 MED ORDER — ONDANSETRON HCL 4 MG/2ML IJ SOLN
4.0000 mg | Freq: Once | INTRAMUSCULAR | Status: AC
  Administered 2019-01-28: 10:00:00 4 mg via INTRAVENOUS
  Filled 2019-01-28: qty 2

## 2019-01-28 MED ORDER — SODIUM CHLORIDE 0.9% FLUSH
3.0000 mL | Freq: Once | INTRAVENOUS | Status: AC
  Administered 2019-01-28: 08:00:00 3 mL via INTRAVENOUS

## 2019-01-28 MED ORDER — SODIUM CHLORIDE 0.9 % IV BOLUS
1000.0000 mL | Freq: Once | INTRAVENOUS | Status: AC
  Administered 2019-01-28: 1000 mL via INTRAVENOUS

## 2019-01-28 MED ORDER — CEPHALEXIN 500 MG PO CAPS: 500.0000 mg | ORAL_CAPSULE | Freq: Three times a day (TID) | ORAL | 0 refills | Status: AC

## 2019-01-28 NOTE — Discharge Instructions (Signed)
Take antibiotics as prescribed. Take the entire course.  Make sure you stay well hydrated with water.  Your A1c, a measure for diabetes, was minimally elevated. Follow up with the clinic listed below for recheck of your symptoms and continued monitoring of your A1c.  Follow the guideline in the paperwork regarding diet to help lower the A1c.  Return to the ER with any new, worsening, or concerning symptoms.

## 2019-01-28 NOTE — ED Triage Notes (Signed)
Patient c/o intermittent light headedness, nausea, and diarrhea x 1 week. Patient states worse when she is driving.

## 2019-01-29 NOTE — ED Provider Notes (Signed)
[facility] DEPT Provider Note   CSN: [id number] Arrival date & time: 01/28/19  0715     History   Chief Complaint Chief Complaint  Patient presents with  . Dizziness  . Nausea  . Diarrhea    HPI Lindsey Ortega is a 58 y.o. female presenting for evaluation of dizziness, nausea, diarrhea.  Patient states that the past week, she has been having intermittent episodes of feeling dizzy.  She describes it as a lightheaded/presyncopal feeling.  This is present when she drives, states this is not present at other times.  But then states that she is currently having some of the symptoms.  Initially patient states symptoms are not worse when she goes from sitting to standing, but then states symptoms improve when she lays down.  She reports associated nausea without vomiting.  She reports watery and more frequent stools over the past 2 days.  She denies fevers, chills, headache, vision changes, chest pain, shortness of breath, abdominal pain, or abnormal bowel movements.  She denies urinary symptoms.  Patient states she had GYN surgery for cyst removal several months ago, has not followed up with OB/GYN.  She is not sure if it ago mother both agrees.  She does not currently have a primary care doctor.  Additionally, patient states over the past week when she eats something sweet like candy, she starts to feel very sweaty and feels nauseous.     HPI  Past Medical History:  Diagnosis Date  . Seizures ([place])    Last seizure was during adolescent     Patient Active Problem List   Diagnosis Date Noted  . Vertigo 06/04/2018  . Right ovarian cyst 06/04/2018    Past Surgical History:  Procedure Laterality Date  . EYE SURGERY     in childhood  . ROBOTIC ASSISTED SALPINGO OOPHERECTOMY N/A 09/09/2018   Procedure: ROBOTIC ASSISTED RIGHT SALPINGO-OOPHORECTOMY, LEFT SALPINGECTOMY, MYOMECTOMY;  Surgeon: [name], MD;  Location: [facility];  Service: Gynecology;   Laterality: N/A;     OB History   No obstetric history on file.      Home Medications    Prior to Admission medications   Medication Sig Start Date End Date Taking? Authorizing Provider  cephALEXin (KEFLEX) 500 MG capsule Take 1 capsule (500 mg total) by mouth 3 (three) times daily for 7 days. 01/28/19 02/04/19  Odas Ozer, PA-C    Family History Family History  Problem Relation Age of Onset  . Healthy Mother   . Healthy Father     Social History Social History   Tobacco Use  . Smoking status: Never Smoker  . Smokeless tobacco: Never Used  Substance Use Topics  . Alcohol use: Yes    Alcohol/week: 1.0 standard drinks    Types: 1 Glasses of wine per week    Comment: rare  . Drug use: Never     Allergies   Percocet [oxycodone-acetaminophen] and Latex   Review of Systems Review of Systems  Gastrointestinal: Positive for diarrhea and nausea.  Neurological: Positive for light-headedness.  All other systems reviewed and are negative.    Physical Exam Updated Vital Signs BP (!) 153/82   Pulse 62   Temp 98.2 F (36.8 C) (Oral)   Resp 18   Ht 5' 5.5" (1.664 m)   Wt 63.5 kg   LMP 09/03/2017   SpO2 100%   BMI 22.94 kg/m   Physical Exam Vitals signs and nursing note reviewed.  Constitutional:  General: She is not in acute distress.    Appearance: She is well-developed.     Comments: Resting comfortably in the bed in no acute distress  HENT:     Head: Normocephalic and atraumatic.  Eyes:     Extraocular Movements: Extraocular movements intact.     Conjunctiva/sclera: Conjunctivae normal.     Pupils: Pupils are equal, round, and reactive to light.     Comments: EOMI and PERRLA.  No nystagmus.  Neck:     Musculoskeletal: Normal range of motion and neck supple.  Cardiovascular:     Rate and Rhythm: Normal rate and regular rhythm.     Pulses: Normal pulses.  Pulmonary:     Effort: Pulmonary effort is normal. No respiratory distress.      Breath sounds: Normal breath sounds. No wheezing.     Comments: Speaking in full sentences.  Clear lung sounds in all fields. Abdominal:     General: There is no distension.     Palpations: Abdomen is soft. There is no mass.     Tenderness: There is abdominal tenderness. There is no guarding or rebound.     Comments: Mild discomfort to palpation of suprapubic abdomen.  No rigidity, guarding, distention.  Negative rebound.  No tenderness elsewhere.  Musculoskeletal: Normal range of motion.  Skin:    General: Skin is warm and dry.     Capillary Refill: Capillary refill takes less than 2 seconds.  Neurological:     General: No focal deficit present.     Mental Status: She is alert and oriented to person, place, and time.     GCS: GCS eye subscore is 4. GCS verbal subscore is 5. GCS motor subscore is 6.     Cranial Nerves: Cranial nerves are intact.     Sensory: Sensation is intact.     Motor: Motor function is intact.     Coordination: Coordination is intact. Coordination normal. Finger-Nose-Finger Test and Heel to Shin Test normal.      ED Treatments / Results  Labs (all labs ordered are listed, but only abnormal results are displayed) Labs Reviewed  COMPREHENSIVE METABOLIC PANEL - Abnormal; Notable for the following components:      Result Value   Glucose, Bld 103 (*)    Total Protein 8.3 (*)    All other components within normal limits  URINALYSIS, ROUTINE W REFLEX MICROSCOPIC - Abnormal; Notable for the following components:   Hgb urine dipstick SMALL (*)    Nitrite POSITIVE (*)    Leukocytes,Ua TRACE (*)    Bacteria, UA MANY (*)    All other components within normal limits  HEMOGLOBIN A1C - Abnormal; Notable for the following components:   Hgb A1c MFr Bld 5.9 (*)    All other components within normal limits  LIPASE, BLOOD  TSH  CBC WITH DIFFERENTIAL/PLATELET    EKG EKG Interpretation  Date/Time:  Wednesday January 28 2019 07:41:41 EST Ventricular Rate:  81 PR  Interval:    QRS Duration: 87 QT Interval:  358 QTC Calculation: 416 R Axis:   31 Text Interpretation: Sinus rhythm No significant change since last tracing Confirmed by Blanchie Dessert [phone]) on 01/28/2019 7:53:31 AM   Radiology No results found.  Procedures Procedures (including critical care time)  Medications Ordered in ED Medications  sodium chloride flush (NS) 0.9 % injection 3 mL (3 mLs Intravenous Given 01/28/19 0812)  sodium chloride 0.9 % bolus 500 mL (0 mLs Intravenous Stopped 01/28/19 0939)  meclizine (ANTIVERT) tablet  25 mg (25 mg Oral Given 01/28/19 0811)  cefTRIAXone (ROCEPHIN) 1 g in sodium chloride 0.9 % 100 mL IVPB (0 g Intravenous Stopped 01/28/19 1234)  sodium chloride 0.9 % bolus 1,000 mL (0 mLs Intravenous Stopped 01/28/19 1234)  ondansetron (ZOFRAN) injection 4 mg (4 mg Intravenous Given 01/28/19 1017)     Initial Impression / Assessment and Plan / ED Course  I have reviewed the triage vital signs and the nursing notes.  Pertinent labs & imaging results that were available during my care of the patient were reviewed by me and considered in my medical decision making (see chart for details).        Patient presenting for evaluation of intermittent lightheadedness, nausea, and diarrhea.  Physical exam shows patient appears nontoxic.  She does have some discomfort with suprapubic abdomen, while she is not having obvious urinary symptoms, she is having nausea and diarrhea.  As such, I will check a urine.  We will check basic labs to assess hemoglobin, electrolytes, kidney and liver function.  A1c ordered due to abnormal feelings when patient eats something sweet.  Will obtain orthostatic vital signs.  Meclizine and fluids for sx control.   Labs reassuring, no leukocytosis.  Electrolytes stable.  A1c only mildly elevated at 5.9.  TSH is normal.  Urine positive for infection with many bacteria, trace leuks and positive nitrates.  As she has suprapubic  discomfort, nausea, lightheadedness, diarrhea, will treat for UTI.  Orthostatics show slight drop in blood pressure and slight elevation in heart rate with change in position.  As such, will give more fluids and a dose of Rocephin while waiting.  EKG without STEMI.  On reassessment, patient reports symptoms are improved with medications.  Discussed treatment for UTI at home, importance of hydration.  Discussed importance of follow-up with pcp, resources given. At this time, pt appears safe for discharge.  Return precautions given.  Patient states he understands and agrees to plan.    Received call from patient stating that her prescriptions not sent to her pharmacy.  She was given paper prescription, however she does not have this at this time. Prescription called into [facility].   Final Clinical Impressions(s) / ED Diagnoses   Final diagnoses:  Urinary tract infection without hematuria, site unspecified  Elevated hemoglobin A1c    ED Discharge Orders         Ordered    cephALEXin (KEFLEX) 500 MG capsule  3 times daily     01/28/19 [place], Masiah Lewing, PA-C 01/29/19 0756    Blanchie Dessert, MD 01/29/19 1047

## 2019-02-03 ENCOUNTER — Telehealth: Payer: Self-pay | Admitting: Surgery

## 2019-02-03 NOTE — Telephone Encounter (Signed)
ED CM attempted to contact patient via phone to assist with establishing primary  Care. No answer CM will make 2nd attempt tomorrow 11/18.

## 2019-04-16 IMAGING — DX CHEST - 2 VIEW
2 series · 2 of 2 positions shown · non-contrast
Comparison: None.

CLINICAL DATA: Acute chest pain and shortness of breath for 2
weeks.

EXAM:
CHEST - 2 VIEW

[chest pa]
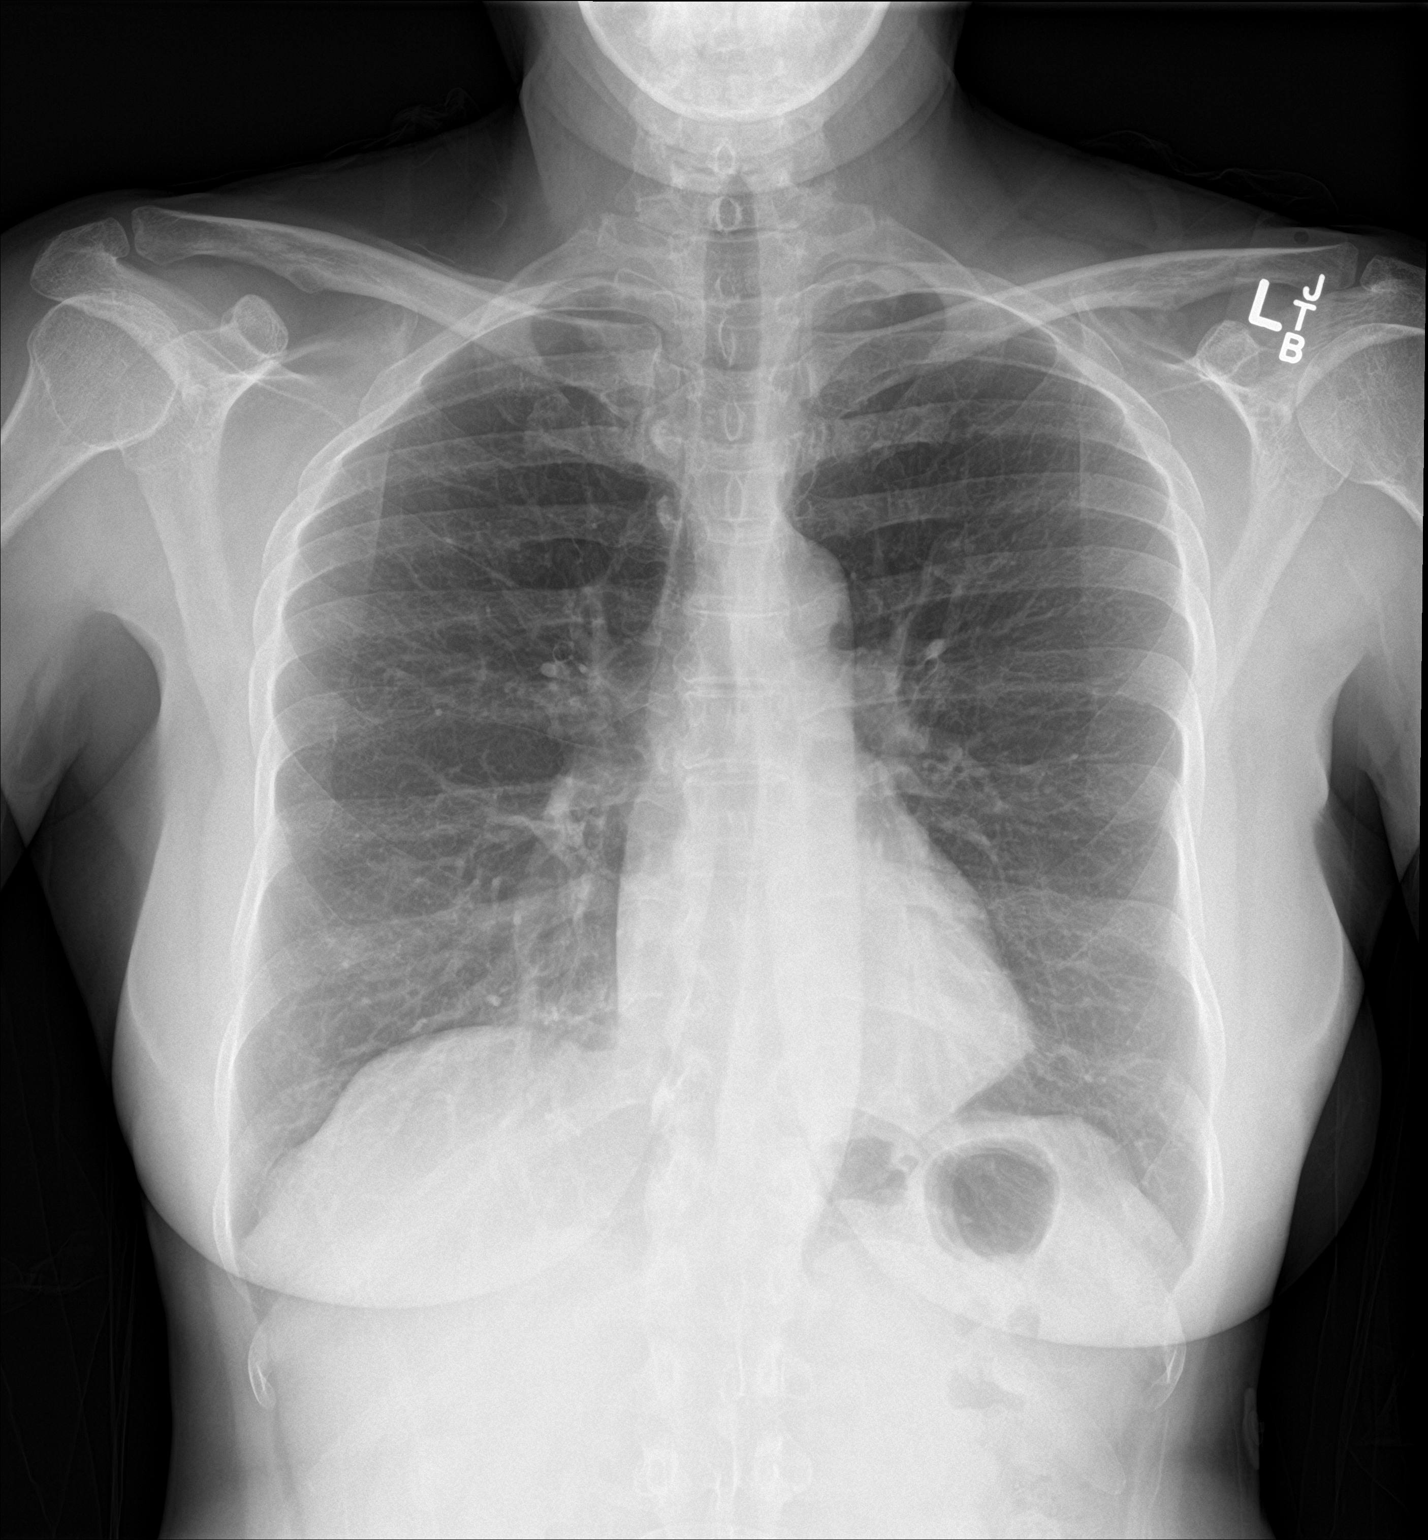

[chest lat]
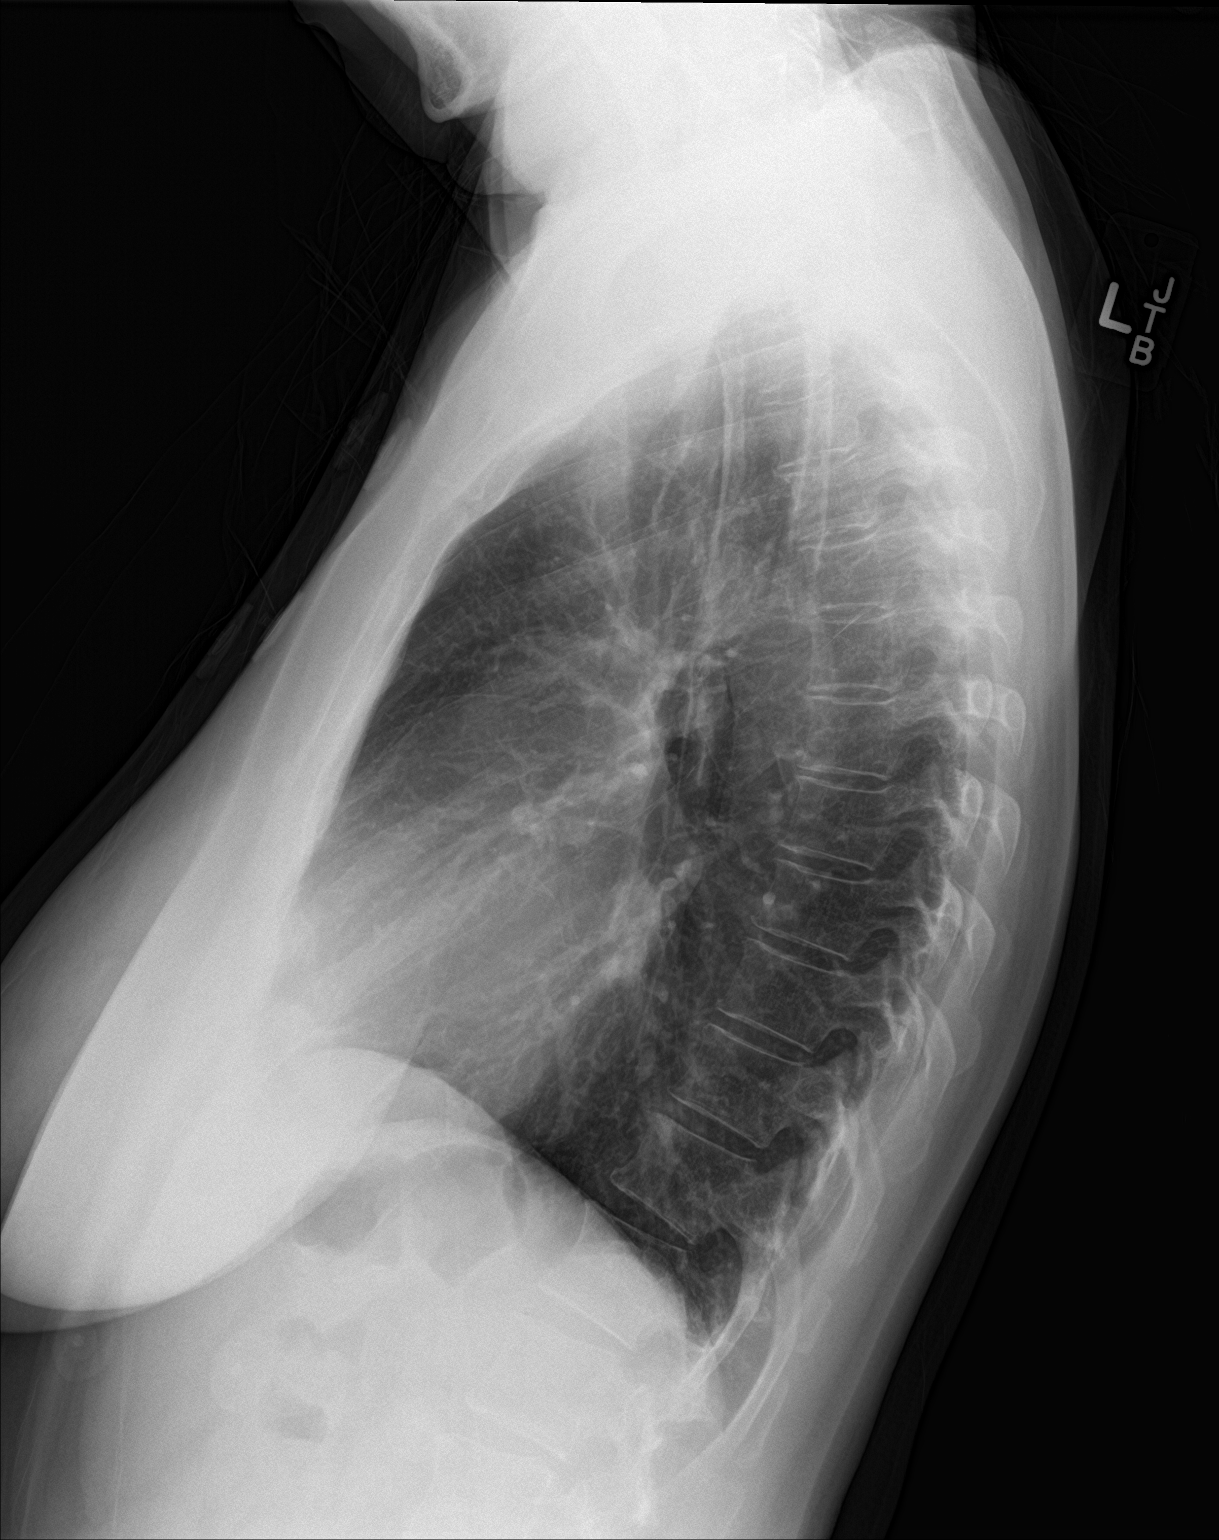

[2 of 2 positions shown; findings below may reference images not displayed]

FINDINGS: The cardiomediastinal silhouette is unremarkable.

There is no evidence of focal airspace disease, pulmonary edema,
suspicious pulmonary nodule/mass, pleural effusion, or pneumothorax.

No acute bony abnormalities are identified.
IMPRESSION: No active cardiopulmonary disease.

## 2020-03-17 ENCOUNTER — Other Ambulatory Visit: Payer: Self-pay

## 2020-03-17 ENCOUNTER — Encounter (HOSPITAL_COMMUNITY): Payer: Self-pay

## 2020-03-17 ENCOUNTER — Emergency Department (HOSPITAL_COMMUNITY): Payer: Self-pay

## 2020-03-17 ENCOUNTER — Emergency Department (HOSPITAL_COMMUNITY)
Admission: EM | Admit: 2020-03-17 | Discharge: 2020-03-17 | Disposition: A | Discharge status: Home / Self Care | Payer: Self-pay | Attending: Emergency Medicine | Admitting: Emergency Medicine

## 2020-03-17 DIAGNOSIS — R0789 Other chest pain: Secondary | ICD-10-CM | POA: Insufficient documentation

## 2020-03-17 DIAGNOSIS — G5602 Carpal tunnel syndrome, left upper limb: Secondary | ICD-10-CM | POA: Insufficient documentation

## 2020-03-17 DIAGNOSIS — Z91040 Latex allergy status: Secondary | ICD-10-CM | POA: Insufficient documentation

## 2020-03-17 LAB — BASIC METABOLIC PANEL
Anion gap: 9 (ref 5–15)
BUN: 8 mg/dL (ref 6–20)
CO2: 28 mmol/L (ref 22–32)
Calcium: 9.3 mg/dL (ref 8.9–10.3)
Chloride: 104 mmol/L (ref 98–111)
Creatinine, Ser: 0.75 mg/dL (ref 0.44–1.00)
GFR, Estimated: >60 mL/min (ref >60)
Glucose, Bld: 94 mg/dL (ref 70–99)
Potassium: 4 mmol/L (ref 3.5–5.1)
Sodium: 141 mmol/L (ref 135–145)

## 2020-03-17 LAB — CBC
HCT: 41.8 % (ref 36.0–46.0)
Hemoglobin: 13.3 g/dL (ref 12.0–15.0)
MCH: 30.4 pg (ref 26.0–34.0)
MCHC: 31.8 g/dL (ref 30.0–36.0)
MCV: 95.4 fL (ref 80.0–100.0)
Platelets: 377 10*3/uL (ref 150–400)
RBC: 4.38 MIL/uL (ref 3.87–5.11)
RDW: 13.8 % (ref 11.5–15.5)
WBC: 7.4 10*3/uL (ref 4.0–10.5)
nRBC: 0 % (ref 0.0–0.2)

## 2020-03-17 LAB — TROPONIN I (HIGH SENSITIVITY): Troponin I (High Sensitivity): 3 ng/L (ref <18)

## 2020-03-17 MED ORDER — NAPROXEN 375 MG PO TABS: 375.0000 mg | ORAL_TABLET | Freq: Two times a day (BID) | ORAL | 0 refills | Status: DC

## 2020-03-17 NOTE — Progress Notes (Signed)
Orthopedic Tech Progress Note Patient Details:  Lindsey Ortega September 05, 1960 [id number]  Ortho Devices Type of Ortho Device: Wrist splint Ortho Device/Splint Location: left Ortho Device/Splint Interventions: Application   Post Interventions Patient Tolerated: Well   Saul Fordyce 03/17/2020, 12:48 PM

## 2020-03-17 NOTE — ED Provider Notes (Signed)
[facility] EMERGENCY DEPARTMENT Provider Note  CSN: [id number] Arrival date & time: 03/17/20 0710    History Chief Complaint  Patient presents with  . Chest Pain    HPI  Lindsey Ortega is a 59 y.o. female with no significant PMH reports about a week of intermittent chest pressure, comes and goes not associated with exertion. She has also had L arm aching and tightness in L shoulder associated with tingling in her L 2-4 fingers and worse when she wakes up in the morning. No SOB, diaphoresis. No cough or fever. She works as a [employer] frequently. No HTN, DM, HLD, tobacco use. No CAD.    Past Medical History:  Diagnosis Date  . Seizures ([facility])    Last seizure was during adolescent     Past Surgical History:  Procedure Laterality Date  . EYE SURGERY     in childhood  . ROBOTIC ASSISTED SALPINGO OOPHERECTOMY N/A 09/09/2018   Procedure: ROBOTIC ASSISTED RIGHT SALPINGO-OOPHORECTOMY, LEFT SALPINGECTOMY, MYOMECTOMY;  Surgeon: Cleda Mccreedy, MD;  Location: [facility];  Service: Gynecology;  Laterality: N/A;    Family History  Problem Relation Age of Onset  . Healthy Mother   . Healthy Father     Social History   Tobacco Use  . Smoking status: Never Smoker  . Smokeless tobacco: Never Used  Vaping Use  . Vaping Use: Never used  Substance Use Topics  . Alcohol use: Yes    Alcohol/week: 1.0 standard drink    Types: 1 Glasses of wine per week    Comment: rare  . Drug use: Never     Home Medications Prior to Admission medications   Medication Sig Start Date End Date Taking? Authorizing Provider  diphenhydramine-acetaminophen (TYLENOL PM EXTRA STRENGTH) 25-500 MG TABS tablet Take 1 tablet by mouth at bedtime as needed (pain/sleep/headache).   Yes [provider]  naproxen (NAPROSYN) 375 MG tablet Take 1 tablet (375 mg total) by mouth 2 (two) times daily. 03/17/20  Yes Pollyann Savoy, MD     Allergies    Percocet  [oxycodone-acetaminophen] and Latex   Review of Systems   Review of Systems A comprehensive review of systems was completed and negative except as noted in HPI.    Physical Exam BP 139/84   Pulse (!) 55   Temp 98.2 F (36.8 C) (Oral)   Resp 14   Ht 5' 5.5" (1.664 m)   Wt 56.7 kg   LMP 09/03/2017   SpO2 100%   BMI 20.48 kg/m   Physical Exam Vitals and nursing note reviewed.  Constitutional:      Appearance: Normal appearance.  HENT:     Head: Normocephalic and atraumatic.     Nose: Nose normal.     Mouth/Throat:     Mouth: Mucous membranes are moist.  Eyes:     Extraocular Movements: Extraocular movements intact.     Conjunctiva/sclera: Conjunctivae normal.  Cardiovascular:     Rate and Rhythm: Normal rate.  Pulmonary:     Effort: Pulmonary effort is normal.     Breath sounds: Normal breath sounds.  Abdominal:     General: Abdomen is flat.     Palpations: Abdomen is soft.     Tenderness: There is no abdominal tenderness.  Musculoskeletal:        General: No swelling. Normal range of motion.     Cervical back: Neck supple.     Comments: Tenderness in L trapezius muscles, positive phalen's and  tinel's signs.   Skin:    General: Skin is warm and dry.  Neurological:     General: No focal deficit present.     Mental Status: She is alert.  Psychiatric:        Mood and Affect: Mood normal.      ED Results / Procedures / Treatments   Labs (all labs ordered are listed, but only abnormal results are displayed) Labs Reviewed  BASIC METABOLIC PANEL  CBC  TROPONIN I (HIGH SENSITIVITY)    EKG EKG Interpretation  Date/Time:  Thursday March 17 2020 07:21:42 EST Ventricular Rate:  87 PR Interval:    QRS Duration: 96 QT Interval:  357 QTC Calculation: 430 R Axis:   63 Text Interpretation: Sinus rhythm Low voltage, precordial leads Borderline T abnormalities, anterior leads 12 Lead; Mason-Likar No significant change since last tracing Confirmed by Calvert Cantor [phone]) on 03/17/2020 9:18:41 AM   Radiology DG Chest 2 View  Result Date: 03/17/2020 CLINICAL DATA:  Chest pain EXAM: CHEST - 2 VIEW COMPARISON:  05/29/2018 chest radiograph. FINDINGS: Stable cardiomediastinal silhouette with normal heart size. No pneumothorax. No pleural effusion. Lungs appear clear, with no acute consolidative airspace disease and no pulmonary edema. IMPRESSION: No active cardiopulmonary disease. Electronically Signed   By: Ilona Sorrel M.D.   On: 03/17/2020 10:08    Procedures Procedures  Medications Ordered in the ED Medications - No data to display   MDM Rules/Calculators/A&P MDM Patient with atypical chest pain, LUE symptoms most consistent with carpal tunnel syndrome. Will check labs and CXR to rule out cardiac etiology. EKG unremarkable.  ED Course  I have reviewed the triage vital signs and the nursing notes.  Pertinent labs & imaging results that were available during my care of the patient were reviewed by me and considered in my medical decision making (see chart for details).  Clinical Course as of 03/17/20 1232  Thu Mar 17, 2020  1217 BMP and Trop negative. Given duration of symptoms, atypical presentation and low risk factor profile I do not feel that repeat Troponin would be necessary.  [CS]  [id number] HEART Pathway is 1. Will place in wrist brace for presumed carpal tunnel syndrome. D/C with Rx for Naprosyn, referral to Hand and PCP follow up.  [CS]    Clinical Course User Index [CS] Truddie Hidden, MD    Final Clinical Impression(s) / ED Diagnoses Final diagnoses:  Atypical chest pain  Carpal tunnel syndrome, left    Rx / DC Orders ED Discharge Orders         Ordered    naproxen (NAPROSYN) 375 MG tablet  2 times daily        03/17/20 1231           Truddie Hidden, MD 03/17/20 1232

## 2020-03-17 NOTE — ED Triage Notes (Signed)
Pt presents with c/o chest pain that has been present for approx one week. Pt reports she has also had some left arm pain with the chest pain that radiates to her neck. Pt also reports some intermittent dizziness.

## 2020-04-28 ENCOUNTER — Encounter (HOSPITAL_COMMUNITY): Payer: Self-pay

## 2020-04-28 ENCOUNTER — Emergency Department (HOSPITAL_COMMUNITY): Payer: Self-pay

## 2020-04-28 ENCOUNTER — Emergency Department (HOSPITAL_COMMUNITY)
Admission: EM | Admit: 2020-04-28 | Discharge: 2020-04-28 | Disposition: A | Discharge status: Home / Self Care | Payer: Self-pay | Attending: Emergency Medicine | Admitting: Emergency Medicine

## 2020-04-28 ENCOUNTER — Other Ambulatory Visit: Payer: Self-pay

## 2020-04-28 DIAGNOSIS — M5416 Radiculopathy, lumbar region: Secondary | ICD-10-CM | POA: Insufficient documentation

## 2020-04-28 DIAGNOSIS — R002 Palpitations: Secondary | ICD-10-CM | POA: Insufficient documentation

## 2020-04-28 DIAGNOSIS — R079 Chest pain, unspecified: Secondary | ICD-10-CM | POA: Insufficient documentation

## 2020-04-28 DIAGNOSIS — Z91040 Latex allergy status: Secondary | ICD-10-CM | POA: Insufficient documentation

## 2020-04-28 LAB — BASIC METABOLIC PANEL
Anion gap: 11 (ref 5–15)
BUN: 7 mg/dL (ref 6–20)
CO2: 27 mmol/L (ref 22–32)
Calcium: 9.3 mg/dL (ref 8.9–10.3)
Chloride: 102 mmol/L (ref 98–111)
Creatinine, Ser: 0.94 mg/dL (ref 0.44–1.00)
GFR, Estimated: >60 mL/min (ref >60)
Glucose, Bld: 95 mg/dL (ref 70–99)
Potassium: 3.7 mmol/L (ref 3.5–5.1)
Sodium: 140 mmol/L (ref 135–145)

## 2020-04-28 LAB — CBC
HCT: 40.6 % (ref 36.0–46.0)
Hemoglobin: 13.2 g/dL (ref 12.0–15.0)
MCH: 30.6 pg (ref 26.0–34.0)
MCHC: 32.5 g/dL (ref 30.0–36.0)
MCV: 94 fL (ref 80.0–100.0)
Platelets: 343 10*3/uL (ref 150–400)
RBC: 4.32 MIL/uL (ref 3.87–5.11)
RDW: 13.7 % (ref 11.5–15.5)
WBC: 6 10*3/uL (ref 4.0–10.5)
nRBC: 0 % (ref 0.0–0.2)

## 2020-04-28 LAB — TROPONIN I (HIGH SENSITIVITY): Troponin I (High Sensitivity): 3 ng/L (ref <18)

## 2020-04-28 MED ORDER — IBUPROFEN 200 MG PO TABS
400.0000 mg | ORAL_TABLET | Freq: Once | ORAL | Status: DC
  Filled 2020-04-28: qty 2

## 2020-04-28 MED ORDER — NAPROXEN 375 MG PO TABS: 375.0000 mg | ORAL_TABLET | Freq: Two times a day (BID) | ORAL | 0 refills | Status: AC

## 2020-04-28 MED ORDER — ACETAMINOPHEN 500 MG PO TABS
1000.0000 mg | ORAL_TABLET | Freq: Once | ORAL | Status: AC
  Administered 2020-04-28: 1000 mg via ORAL
  Filled 2020-04-28: qty 2

## 2020-04-28 MED ORDER — CYCLOBENZAPRINE HCL 10 MG PO TABS: 10.0000 mg | ORAL_TABLET | Freq: Two times a day (BID) | ORAL | 0 refills | Status: AC | PRN

## 2020-04-28 NOTE — ED Triage Notes (Signed)
Patient reports that she began having intermittent chest pain since yesterday and at 0200 today she was wakened by heart racing with chest pain. Patient also c/o bilateral lower back pain that radiates down both legs.

## 2020-04-28 NOTE — ED Provider Notes (Addendum)
[place] DEPT Provider Note   CSN: [id number] Arrival date & time: 04/28/20  3532     History Chief Complaint  Patient presents with  . Chest Pain  . Back Pain  . Palpitations    Lindsey Ortega is a 60 y.o. female.  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  Patient's biggest complaint is of low back pain that radiates into her right lower extremity.  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  She has to lift her from the bed to the wheelchair.  Last week she started noticing pain in her back and she started modifying the lifting but continued until last week when the pain became more severe and she stopped lifting altogether.  She reports the pain is mostly in the right side of her back and radiates all the way down the leg.  She does not have any significant weakness but she has a hard time moving her leg because it is so uncomfortable.  She has taken some Tylenol PM for the pain but no other medications.  Movement seems to make it worse however it hurts constantly.  She denies any numbness of the leg.  No bowel or bladder complaints.  Pain is an 8 out of 10.  She has had issues with back pain in the past but it just went away when she stopped lifting and she did not take any medications at that time.  She has no history of IV drug abuse.  No prior back surgeries.  She also reports that last night while she was sleeping she woke up with palpitations.  She reports it lasted no more than a minute but did concern her.  She has had episodes of this in the past the most recent one before last night was about 2 weeks ago.  It can happen at any part of the day and usually does not last more than a minute.  She has never had this evaluated.  She denies any tobacco or alcohol use.  No  recent illness chest pain or shortness of breath.  The history is provided by the patient.  Chest Pain Associated symptoms: back pain and palpitations   Back Pain Associated symptoms: chest pain   Palpitations Associated symptoms: back pain and chest pain        Past Medical History:  Diagnosis Date  . Seizures ([place])    Last seizure was during adolescent     Patient Active Problem List   Diagnosis Date Noted  . Vertigo 06/04/2018  . Right ovarian cyst 06/04/2018    Past Surgical History:  Procedure Laterality Date  . EYE SURGERY     in childhood  . ROBOTIC ASSISTED SALPINGO OOPHERECTOMY N/A 09/09/2018   Procedure: ROBOTIC ASSISTED RIGHT SALPINGO-OOPHORECTOMY, LEFT SALPINGECTOMY, MYOMECTOMY;  Surgeon: [name], MD;  Location: [facility];  Service: Gynecology;  Laterality: N/A;     OB History   No obstetric history on file.     Family History  Problem Relation Age of Onset  . Healthy Mother   . Healthy Father     Social History   Tobacco Use  . Smoking status: Never Smoker  . Smokeless tobacco: Never Used  Vaping Use  . Vaping Use: Never used  Substance Use Topics  . Alcohol use: Yes  Alcohol/week: 1.0 standard drink    Types: 1 Glasses of wine per week    Comment: rare  . Drug use: Never    Home Medications Prior to Admission medications   Medication Sig Start Date End Date Taking? Authorizing Provider  cyclobenzaprine (FLEXERIL) 10 MG tablet Take 1 tablet (10 mg total) by mouth 2 (two) times daily as needed for muscle spasms. 04/28/20  Yes Mirranda Monrroy, Loree Fee, MD  diphenhydramine-acetaminophen (TYLENOL PM EXTRA STRENGTH) 25-500 MG TABS tablet Take 1 tablet by mouth at bedtime as needed (pain/sleep/headache).    [provider]  naproxen (NAPROSYN) 375 MG tablet Take 1 tablet (375 mg total) by mouth 2 (two) times daily. 04/28/20   Blanchie Dessert, MD    Allergies    Percocet [oxycodone-acetaminophen] and Latex  Review of Systems   Review  of Systems  Cardiovascular: Positive for chest pain and palpitations.  Musculoskeletal: Positive for back pain.  All other systems reviewed and are negative.   Physical Exam Updated Vital Signs BP (!) 158/94   Pulse 69   Temp 98.8 F (37.1 C) (Oral)   Resp 13   Ht 5' 5.5" (1.664 m)   Wt 56.7 kg   LMP 09/03/2017   SpO2 100%   BMI 20.48 kg/m   Physical Exam Vitals and nursing note reviewed.  Constitutional:      General: She is not in acute distress.    Appearance: She is well-developed, normal weight and well-nourished.  HENT:     Head: Normocephalic and atraumatic.  Eyes:     Extraocular Movements: EOM normal.     Pupils: Pupils are equal, round, and reactive to light.  Cardiovascular:     Rate and Rhythm: Normal rate and regular rhythm.     Pulses: Normal pulses and intact distal pulses.     Heart sounds: Normal heart sounds. No murmur heard. No friction rub.  Pulmonary:     Effort: Pulmonary effort is normal. No tachypnea or accessory muscle usage.     Breath sounds: Normal breath sounds. No wheezing or rales.  Abdominal:     General: Bowel sounds are normal. There is no distension.     Palpations: Abdomen is soft.     Tenderness: There is no abdominal tenderness. There is no guarding or rebound.  Musculoskeletal:        General: Tenderness present.     Lumbar back: Spasms and tenderness present. No bony tenderness. Decreased range of motion.       Back:     Right lower leg: No edema.     Left lower leg: No edema.     Comments: No edema  Skin:    General: Skin is warm and dry.     Findings: No rash.  Neurological:     Mental Status: She is alert and oriented to person, place, and time.     Cranial Nerves: No cranial nerve deficit.     Sensory: No sensory deficit.     Motor: No weakness.     Comments: 5 out of 5 strength in hip flexion/extension, 5 out of 5 strength in the quadricep flexion/extension bilaterally.  Sensation is intact.  Psychiatric:         Mood and Affect: Mood and affect and mood normal.        Behavior: Behavior normal.        Thought Content: Thought content normal.     ED Results / Procedures / Treatments   Labs (all labs ordered are  listed, but only abnormal results are displayed) Labs Reviewed  BASIC METABOLIC PANEL  CBC  TROPONIN I (HIGH SENSITIVITY)  TROPONIN I (HIGH SENSITIVITY)    EKG EKG Interpretation  Date/Time:  Thursday April 28 2020 07:33:08 EST Ventricular Rate:  75 PR Interval:    QRS Duration: 80 QT Interval:  369 QTC Calculation: 413 R Axis:   70 Text Interpretation: Sinus rhythm 12 Lead; Mason-Likar Normal ECG No significant change since last tracing Confirmed by Blanchie Dessert [id number]) on 04/28/2020 8:53:47 AM   Radiology DG Chest 2 View  Result Date: 04/28/2020 CLINICAL DATA:  Chest pain. EXAM: CHEST - 2 VIEW COMPARISON:  March 17, 2020. FINDINGS: The heart size and mediastinal contours are within normal limits. Both lungs are clear. No pneumothorax or pleural effusion is noted. The visualized skeletal structures are unremarkable. IMPRESSION: No active cardiopulmonary disease. Electronically Signed   By: Marijo Conception M.D.   On: 04/28/2020 08:15    Procedures Procedures   Medications Ordered in ED Medications  ibuprofen (ADVIL) tablet 400 mg (has no administration in time range)  acetaminophen (TYLENOL) tablet 1,000 mg (has no administration in time range)    ED Course  I have reviewed the triage vital signs and the nursing notes.  Pertinent labs & imaging results that were available during my care of the patient were reviewed by me and considered in my medical decision making (see chart for details).    MDM Rules/Calculators/A&P                          Pt with gradual onset of back pain suggestive of radiculopathy.  No neurovascular compromise and no incontinence.  Pt has no infectious sx, hx of CA  or other red flags concerning for pathologic back pain.  Pt is able  to ambulate but is painful.  Normal strength and reflexes on exam.  Denies trauma. Will give pt pain control and to return for developement of above sx. Patient also reports a history of palpitations that woke her up last night.  She has had intermittent episodes of palpitations all which are usually within a minute or less.  Patient's labs today are within normal limits including BMP, CBC, troponin.  EKG within normal limits today and chest x-ray without acute findings.  Vital signs are within normal limits.  Patient is not tachycardic to suggest hyperthyroidism.  She has never been evaluated for this.  Did discuss with her that she may benefit from a monitor if this starts happening more frequently.  She does not have insurance or PCP.  Will contact social work so they can hopefully help her with PCP.  She was also given outpatient resources.  MDM Number of Diagnoses or Management Options   Amount and/or Complexity of Data Reviewed Clinical lab tests: ordered and reviewed Tests in the radiology section of CPT: ordered and reviewed Tests in the medicine section of CPT: reviewed and ordered Decide to obtain previous medical records or to obtain history from someone other than the patient: yes Obtain history from someone other than the patient: yes Review and summarize past medical records: yes Discuss the patient with other providers: no Independent visualization of images, tracings, or specimens: yes  Risk of Complications, Morbidity, and/or Mortality Presenting problems: moderate Diagnostic procedures: low Management options: low  Patient Progress Patient progress: stable   Final Clinical Impression(s) / ED Diagnoses Final diagnoses:  Heart palpitations  Lumbar radiculopathy, acute    Rx /  DC Orders ED Discharge Orders         Ordered    cyclobenzaprine (FLEXERIL) 10 MG tablet  2 times daily PRN        04/28/20 0926    naproxen (NAPROSYN) 375 MG tablet  2 times daily         04/28/20 0926           Blanchie Dessert, MD 04/28/20 4136    Blanchie Dessert, MD 04/28/20 [phone]

## 2020-04-28 NOTE — Discharge Instructions (Addendum)
You can take 2 extra strength tylenol with the prescription medications as needed every 6 hours for the pain.  Also heating pad may help.  No lifting.

## 2021-03-16 IMAGING — CR DG CHEST 2V
2 series · 2 of 2 positions shown · non-contrast
Comparison: March 17, 2020.

CLINICAL DATA: Chest pain.

EXAM:
CHEST - 2 VIEW

[w chest pa]
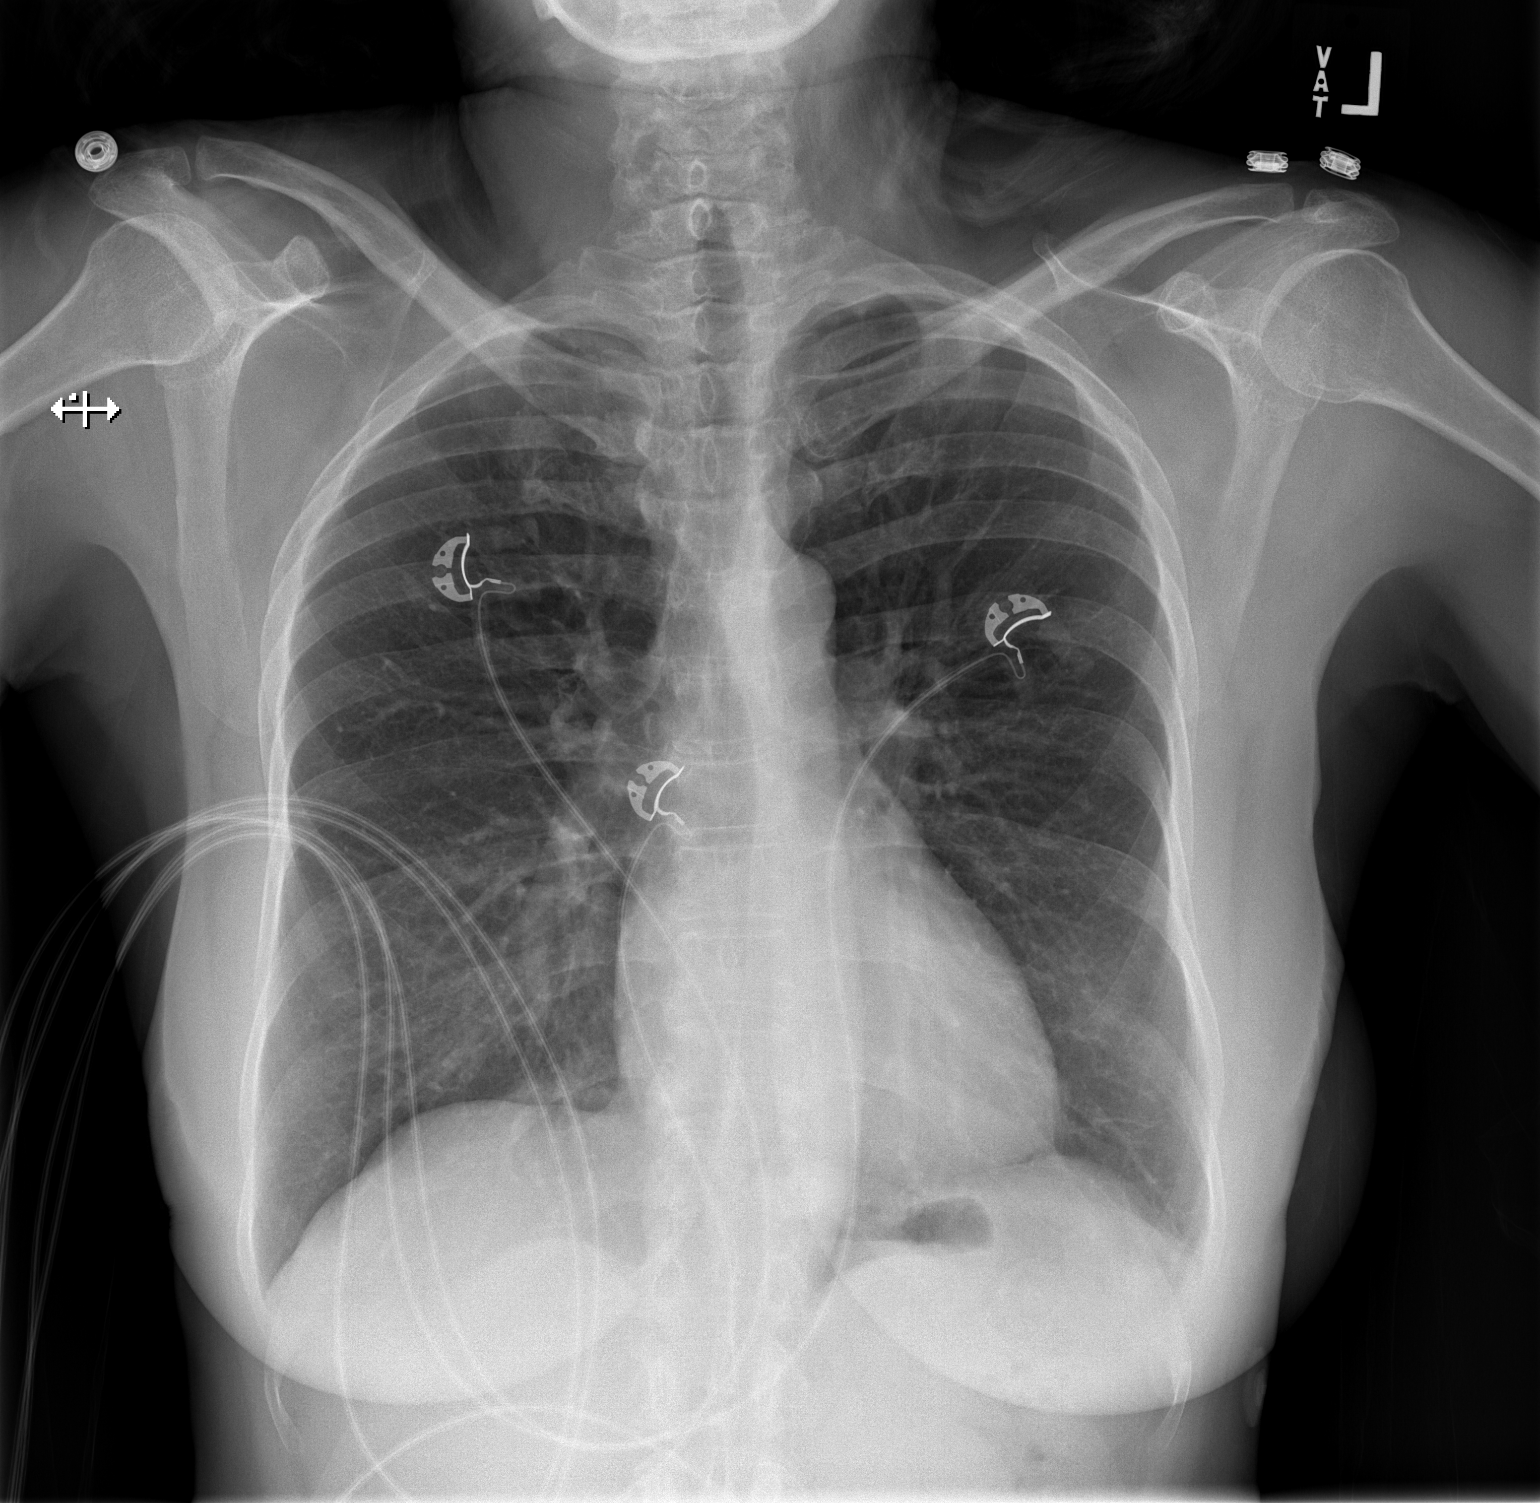

[w chest lat]
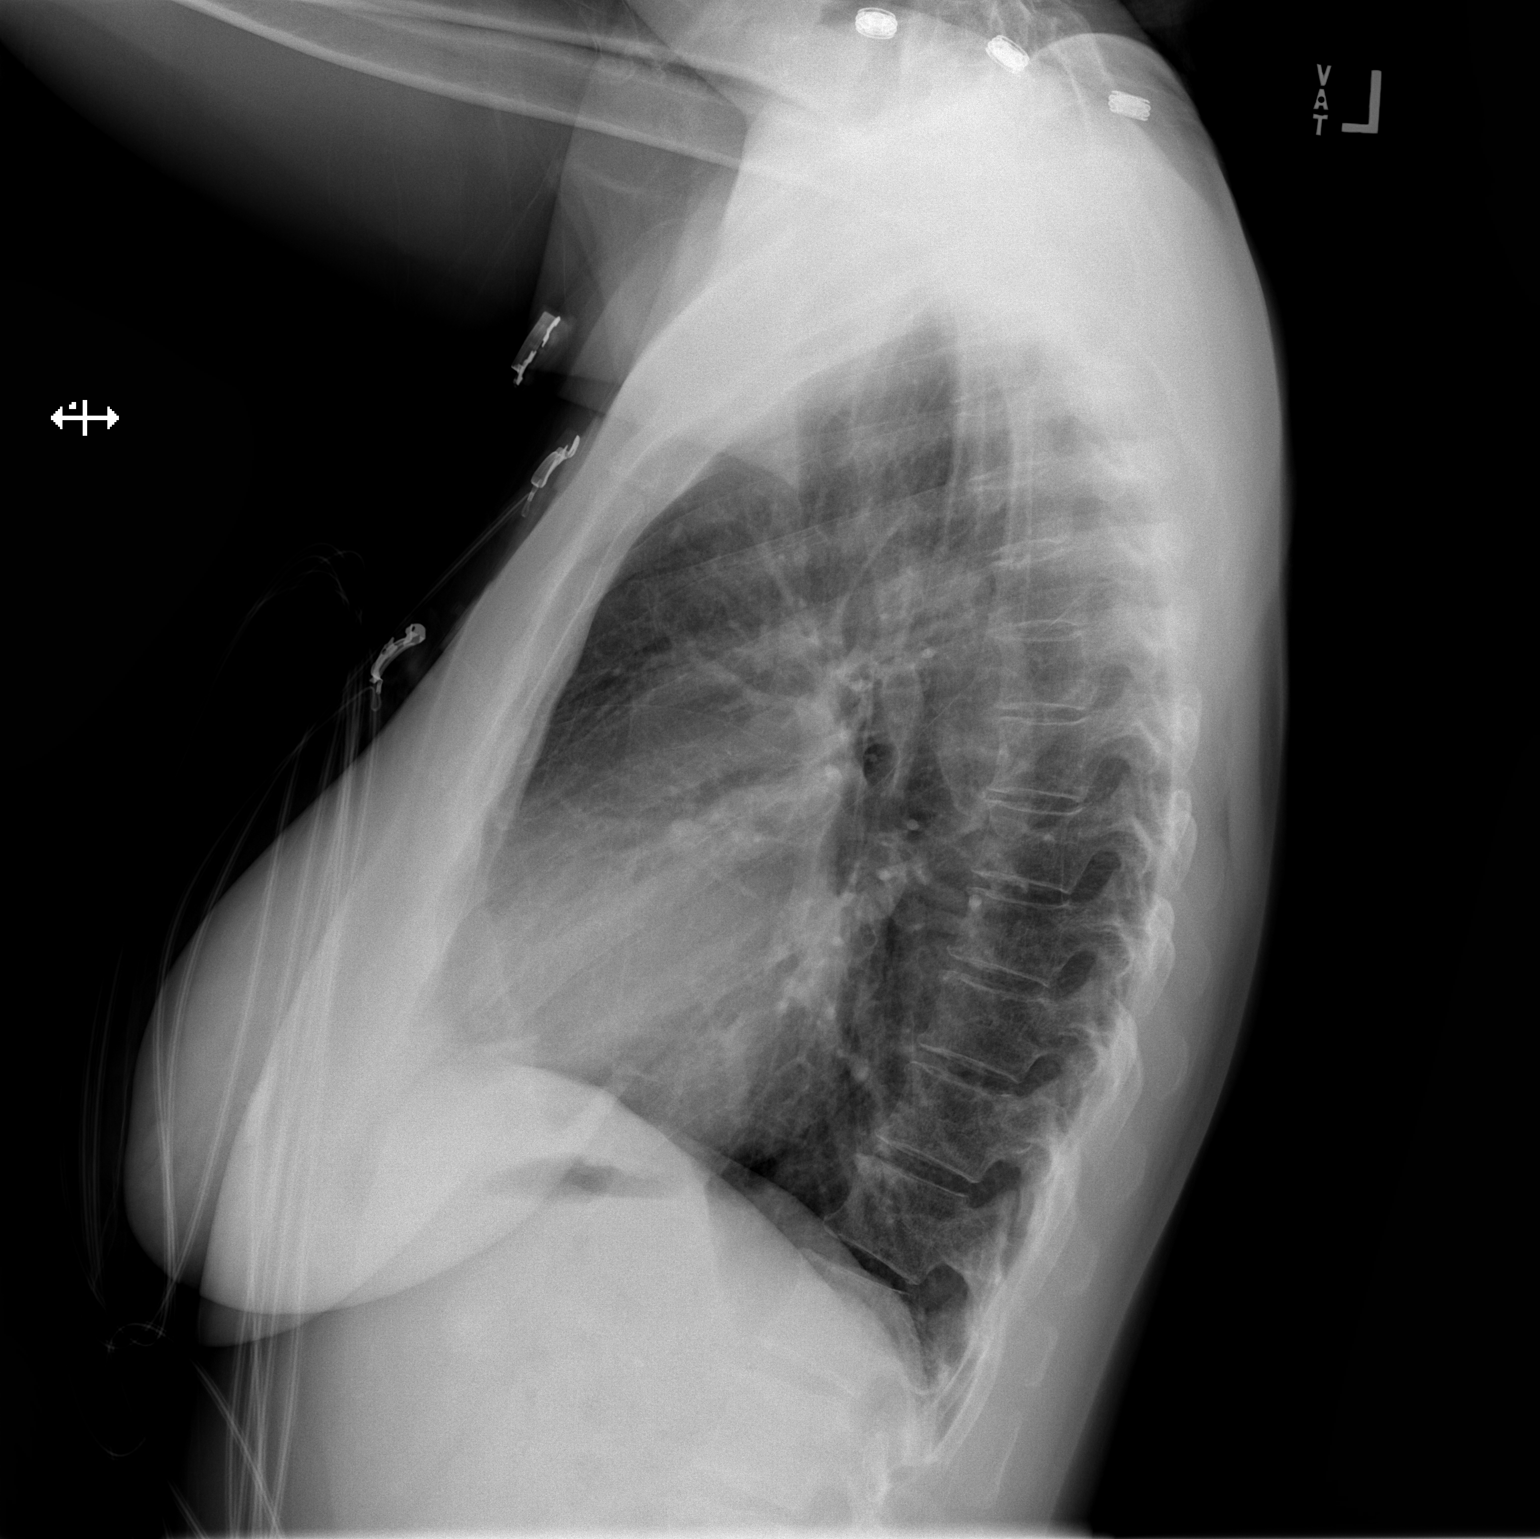

[2 of 2 positions shown; findings below may reference images not displayed]

FINDINGS: The heart size and mediastinal contours are within normal limits.
Both lungs are clear. No pneumothorax or pleural effusion is noted.
The visualized skeletal structures are unremarkable.
IMPRESSION: No active cardiopulmonary disease.

## 2023-05-23 ENCOUNTER — Emergency Department (HOSPITAL_COMMUNITY)
Admission: EM | Admit: 2023-05-23 | Discharge: 2023-05-23 | Disposition: A | Discharge status: Home / Self Care | Payer: Self-pay

## 2023-05-23 ENCOUNTER — Emergency Department (HOSPITAL_COMMUNITY): Payer: Self-pay

## 2023-05-23 DIAGNOSIS — R059 Cough, unspecified: Secondary | ICD-10-CM | POA: Insufficient documentation

## 2023-05-23 DIAGNOSIS — Z91040 Latex allergy status: Secondary | ICD-10-CM | POA: Insufficient documentation

## 2023-05-23 DIAGNOSIS — R0789 Other chest pain: Secondary | ICD-10-CM | POA: Insufficient documentation

## 2023-05-23 DIAGNOSIS — R079 Chest pain, unspecified: Secondary | ICD-10-CM

## 2023-05-23 LAB — CBC WITH DIFFERENTIAL/PLATELET
Abs Immature Granulocytes: 0.01 10*3/uL (ref 0.00–0.07)
Basophils Absolute: 0 10*3/uL (ref 0.0–0.1)
Basophils Relative: 1 %
Eosinophils Absolute: 0.1 10*3/uL (ref 0.0–0.5)
Eosinophils Relative: 2 %
HCT: 38.3 % (ref 36.0–46.0)
Hemoglobin: 12.2 g/dL (ref 12.0–15.0)
Immature Granulocytes: 0 %
Lymphocytes Relative: 30 %
Lymphs Abs: 1.7 10*3/uL (ref 0.7–4.0)
MCH: 30.3 pg (ref 26.0–34.0)
MCHC: 31.9 g/dL (ref 30.0–36.0)
MCV: 95.3 fL (ref 80.0–100.0)
Monocytes Absolute: 0.5 10*3/uL (ref 0.1–1.0)
Monocytes Relative: 8 %
Neutro Abs: 3.2 10*3/uL (ref 1.7–7.7)
Neutrophils Relative %: 59 %
Platelets: 277 10*3/uL (ref 150–400)
RBC: 4.02 MIL/uL (ref 3.87–5.11)
RDW: 13.2 % (ref 11.5–15.5)
WBC: 5.4 10*3/uL (ref 4.0–10.5)
nRBC: 0 % (ref 0.0–0.2)

## 2023-05-23 LAB — BASIC METABOLIC PANEL
Anion gap: 11 (ref 5–15)
BUN: 12 mg/dL (ref 8–23)
CO2: 25 mmol/L (ref 22–32)
Calcium: 9.3 mg/dL (ref 8.9–10.3)
Chloride: 105 mmol/L (ref 98–111)
Creatinine, Ser: 0.8 mg/dL (ref 0.44–1.00)
GFR, Estimated: >60 mL/min (ref >60)
Glucose, Bld: 104 mg/dL — ABNORMAL HIGH (ref 70–99)
Potassium: 4.1 mmol/L (ref 3.5–5.1)
Sodium: 141 mmol/L (ref 135–145)

## 2023-05-23 LAB — TROPONIN I (HIGH SENSITIVITY): Troponin I (High Sensitivity): <2 ng/L (ref <18)

## 2023-05-23 MED ORDER — ONDANSETRON HCL 4 MG PO TABS: 4.0000 mg | ORAL_TABLET | Freq: Four times a day (QID) | ORAL | 0 refills | Status: AC

## 2023-05-23 NOTE — ED Triage Notes (Signed)
 Pt arrives via [facility] from home. 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 She states that she got sick at that time, and then over the last two weeks she developed a "throbbing" chest pain intermittently. She also endorses having nausea in the mornings most days. Denies SOB.

## 2023-05-23 NOTE — ED Provider Notes (Signed)
 [place] EMERGENCY DEPARTMENT AT [facility] Provider Note   CSN: [id number] Arrival date & time: 05/23/23  7829     History  Chief Complaint  Patient presents with   Chest Pain    Lindsey Ortega is a 63 y.o. female.   Chest Pain Associated symptoms: fatigue and nausea   Patient is a 62 year old female who presents today with a 2-week history of intermittent "chest throbbing", intermittent vertigo, intermittent nausea, fatigue after being sick with possible influenza.  Previous medical history of seizures but not on any prophylaxis.  Reports the throbbing is relieved with ibuprofen.  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  Denies exertional symptoms.  Does not currently have a PCP.  Endorses intermittent cough. Denies fever, headache, blurry vision, sore throat, hemoptysis, shortness of breath, abdominal pain, vomiting, diarrhea, dysuria, vaginal discharge, melena, hematochezia, lower extremity edema/pain.  Home Medications Prior to Admission medications   Medication Sig Start Date End Date Taking? Authorizing Provider  ondansetron (ZOFRAN) 4 MG tablet Take 1 tablet (4 mg total) by mouth every 6 (six) hours. 05/23/23  Yes Lunette Stands, PA-C  cyclobenzaprine (FLEXERIL) 10 MG tablet Take 1 tablet (10 mg total) by mouth 2 (two) times daily as needed for muscle spasms. 04/28/20   Gwyneth Sprout, MD  diphenhydramine-acetaminophen (TYLENOL PM EXTRA STRENGTH) 25-500 MG TABS tablet Take 1 tablet by mouth at bedtime as needed (pain/sleep/headache).    [provider]  naproxen (NAPROSYN) 375 MG tablet Take 1 tablet (375 mg total) by mouth 2 (two) times daily. 04/28/20   Gwyneth Sprout, MD      Allergies    Percocet [oxycodone-acetaminophen] and Latex    Review of Systems   Review  of Systems  Constitutional:  Positive for fatigue.  Cardiovascular:  Positive for chest pain.  Gastrointestinal:  Positive for nausea.  All other systems reviewed and are negative.   Physical Exam Updated Vital Signs BP (!) 146/98   Pulse (!) 58   Temp 98.6 F (37 C)   Resp 14   LMP 09/03/2017   SpO2 99%  Physical Exam Vitals and nursing note reviewed.  Constitutional:      General: She is not in acute distress.    Appearance: Normal appearance. She is not ill-appearing.  HENT:     Head: Normocephalic and atraumatic.     Mouth/Throat:     Mouth: Mucous membranes are moist.     Pharynx: Oropharynx is clear. No oropharyngeal exudate or posterior oropharyngeal erythema.  Eyes:     General: No scleral icterus.       Right eye: No discharge.        Left eye: No discharge.     Extraocular Movements: Extraocular movements intact.     Conjunctiva/sclera: Conjunctivae normal.  Neck:     Vascular: No JVD.     Trachea: No tracheal deviation.  Cardiovascular:     Rate and Rhythm: Normal rate and regular rhythm.     Pulses: Normal pulses.     Heart sounds: Normal heart sounds. No murmur heard.    No friction rub. No gallop.  Pulmonary:     Effort: Pulmonary effort is normal. No tachypnea, accessory muscle usage or respiratory distress.     Breath sounds: Normal breath sounds. No stridor. No decreased breath sounds, wheezing, rhonchi or rales.  Chest:     Chest wall: No deformity, tenderness, crepitus or edema.  Abdominal:     General: Abdomen is flat. There is no distension.     Palpations: Abdomen is soft.     Tenderness: There is no abdominal tenderness. There is no guarding or rebound.  Musculoskeletal:        General: No tenderness.     Right lower leg: No tenderness. No edema.     Left lower leg: No tenderness. No edema.  Skin:    General: Skin is warm and dry.     Coloration: Skin is not cyanotic, jaundiced or pale.     Findings: No erythema or rash.  Neurological:      General: No focal deficit present.     Mental Status: She is alert and oriented to person, place, and time. Mental status is at baseline.     Motor: No weakness.  Psychiatric:        Mood and Affect: Mood normal. Mood is not anxious.        Behavior: Behavior is not agitated.     ED Results / Procedures / Treatments   Labs (all labs ordered are listed, but only abnormal results are displayed) Labs Reviewed  BASIC METABOLIC PANEL - Abnormal; Notable for the following components:      Result Value   Glucose, Bld 104 (*)    All other components within normal limits  CBC WITH DIFFERENTIAL/PLATELET  TROPONIN I (HIGH SENSITIVITY)  TROPONIN I (HIGH SENSITIVITY)    EKG None  Radiology DG Chest Port 1 View Result Date: 05/23/2023 CLINICAL DATA:  63 year old female with cough, URI. EXAM: PORTABLE CHEST 1 VIEW COMPARISON:  Chest radiographs 04/28/2020 and earlier. FINDINGS: Portable AP semi upright view at 0632 hours. Stable lung volumes, upper limits of normal. Normal cardiac size and mediastinal contours. Visualized tracheal air column is within normal limits. Allowing for portable technique the lungs are clear. No pneumothorax or pleural effusion. No acute osseous abnormality identified. Negative visible bowel gas. IMPRESSION: Negative portable chest. Electronically Signed   By: Odessa Fleming M.D.   On: 05/23/2023 06:46    Procedures Procedures    Medications Ordered in ED Medications - No data to display  ED Course/ Medical Decision Making/ A&P            HEART Score: 1                Geneva (Revised) Score: 0, Geneva Score Interpretation: Low Risk Group: 7-9% incidence of pulmonary embolism from several studies   Medical Decision Making  Patient is a 63 year old female who presents today with a 2-week history of intermittent "chest throbbing" that she says is symptoms in the left sometimes on the right, intermittent vertigo, intermittent nausea, fatigue after being sick with  possible influenza.  Previous medical history of seizures but not on any prophylaxis.  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  Denies exertional symptoms.  Currently, patient is not nauseous.   On physical exam, patient is noted to be in no acute distress, afebrile.  No chest tenderness to palpation, no lower extremity edema.  LCTAB.  RRR, no M/G/C/R.  She is speaking in full sentences and ambulating without difficulty.  Alert and oriented x 4.  Both lab work and imaging and ECG have all come back to be  unremarkable.  Low suspicion for ACS, PE, esophageal rupture, aortic dissection, pneumonia at this time as symptoms do not seem to be related to exertion and lab work does not point to any underlying infection or cardiac condition as symptoms have been present for 2 weeks.  Geneva score is 0.  Heart score of 1.  Patient vital signs remained stable for the course of her time here with no tachycardia, tachypnea, hypoxia.  No other GI symptoms present at this time besides nausea, no exertional symptoms suggesting cardiac or pulmonary etiology.  Her blood pressure is elevated will recommend that she follow-up with the PCP as she does not have any PCP at this time.  Will recommend a few clinics for her to schedule a visit for further workup and evaluation.  Low suspicion for any other emergent pathology present at this time.  I believe this patient is safe for discharge at this time  Differential diagnoses prior to evaluation: The emergent differential diagnosis includes, but is not limited to, ACS, PE, aortic dissection, cardiomyopathy, myocarditis, pneumonia, bronchitis, pneumothorax, esophageal rupture, anxiety, muscle strain, mediastinitis. This is not an exhaustive differential.   Past Medical History / Co-morbidities /  Social History: Seizures  Additional history: Chart reviewed. Pertinent results include:   Last seen on 04/28/2020 in the ED for heart palpitations.  Lab Tests/Imaging studies: I personally interpreted labs/imaging and the pertinent results include:   CBC unremarkable BMP unremarkable Troponin unremarkable Chest x-ray unremarkable I agree with the radiologist interpretation.  Cardiac monitoring: EKG obtained and interpreted by myself and attending physician which shows: NSR   Medications: I ordered medication including home Zofran.  I have reviewed the patients home medicines and have made adjustments as needed.    Disposition: After consideration of the diagnostic results and the patients response to treatment, I feel that patient benefit from discharge treatment as above.   emergency department workup does not suggest an emergent condition requiring admission or immediate intervention beyond what has been performed at this time. The plan is: Follow with PCP, return for any new or worsening symptoms, Zofran for intermittent nausea. The patient is safe for discharge and has been instructed to return immediately for worsening symptoms, change in symptoms or any other concerns.   Final Clinical Impression(s) / ED Diagnoses Final diagnoses:  Chest pain, unspecified type    Rx / DC Orders ED Discharge Orders          Ordered    ondansetron (ZOFRAN) 4 MG tablet  Every 6 hours        05/23/23 0800              Lunette Stands, PA-C 05/23/23 0803    Coral Spikes, DO 05/23/23 1601

## 2023-05-23 NOTE — Discharge Instructions (Signed)
 You were seen today for chest pain.  Due to symptoms not being consistent and only being intermittent and not related to exertion, accompanied with a reassuring physical exam, reassuring labs, reassuring imaging and reassuring ECG I believe that you are safe to be discharged at this time.  I will prescribe some Zofran for nausea.  However recommend that you follow-up with your PCP for further workup.  You can look these up online to see which ones are best for you however have a provided an option here for you to go to as well.  If you begin to have any new or worsening symptoms including worsening chest pain, shortness of breath, pain worse with walking, lower leg swelling, persistent fever despite Tylenol and ibuprofen, confusion, one-sided weakness return to the ED for further evaluation.  You can take ibuprofen for pain relief.  Take Tylenol (acetominophen)  650mg  every 4-6 hours, as needed for pain or fever. Do not take more than 4,000 mg in a 24-hour period. As this may cause liver damage. While this is rare, if you begin to develop yellowing of the skin or eyes, stop taking and return to ER immediately.  Take Ibuprofen 400mg  every 4-6 hours for pain or fever, not exceeding 3,200 mg per day as more than 3,200mg  can cause Stomach irritation, dizziness, kidney issues with long-term use.

## 2023-05-28 ENCOUNTER — Encounter (HOSPITAL_COMMUNITY): Payer: Self-pay

## 2023-05-28 ENCOUNTER — Other Ambulatory Visit: Payer: Self-pay

## 2023-05-28 ENCOUNTER — Emergency Department (HOSPITAL_COMMUNITY)
Admission: EM | Admit: 2023-05-28 | Discharge: 2023-05-28 | Disposition: A | Discharge status: Home / Self Care | Payer: Self-pay | Attending: Emergency Medicine | Admitting: Emergency Medicine

## 2023-05-28 DIAGNOSIS — Z91040 Latex allergy status: Secondary | ICD-10-CM | POA: Insufficient documentation

## 2023-05-28 DIAGNOSIS — Z79899 Other long term (current) drug therapy: Secondary | ICD-10-CM | POA: Insufficient documentation

## 2023-05-28 DIAGNOSIS — R079 Chest pain, unspecified: Secondary | ICD-10-CM | POA: Insufficient documentation

## 2023-05-28 LAB — COMPREHENSIVE METABOLIC PANEL
ALT: 12 U/L (ref 0–44)
AST: 20 U/L (ref 15–41)
Albumin: 4 g/dL (ref 3.5–5.0)
Alkaline Phosphatase: 77 U/L (ref 38–126)
Anion gap: 10 (ref 5–15)
BUN: 13 mg/dL (ref 8–23)
CO2: 26 mmol/L (ref 22–32)
Calcium: 9.1 mg/dL (ref 8.9–10.3)
Chloride: 100 mmol/L (ref 98–111)
Creatinine, Ser: 0.72 mg/dL (ref 0.44–1.00)
GFR, Estimated: >60 mL/min (ref >60)
Glucose, Bld: 96 mg/dL (ref 70–99)
Potassium: 3.9 mmol/L (ref 3.5–5.1)
Sodium: 136 mmol/L (ref 135–145)
Total Bilirubin: 0.8 mg/dL (ref 0.0–1.2)
Total Protein: 8.1 g/dL (ref 6.5–8.1)

## 2023-05-28 LAB — CBC WITH DIFFERENTIAL/PLATELET
Abs Immature Granulocytes: 0.02 10*3/uL (ref 0.00–0.07)
Basophils Absolute: 0 10*3/uL (ref 0.0–0.1)
Basophils Relative: 1 %
Eosinophils Absolute: 0 10*3/uL (ref 0.0–0.5)
Eosinophils Relative: 1 %
HCT: 43.5 % (ref 36.0–46.0)
Hemoglobin: 14.2 g/dL (ref 12.0–15.0)
Immature Granulocytes: 0 %
Lymphocytes Relative: 24 %
Lymphs Abs: 1.6 10*3/uL (ref 0.7–4.0)
MCH: 30.9 pg (ref 26.0–34.0)
MCHC: 32.6 g/dL (ref 30.0–36.0)
MCV: 94.6 fL (ref 80.0–100.0)
Monocytes Absolute: 0.3 10*3/uL (ref 0.1–1.0)
Monocytes Relative: 5 %
Neutro Abs: 4.8 10*3/uL (ref 1.7–7.7)
Neutrophils Relative %: 69 %
Platelets: 322 10*3/uL (ref 150–400)
RBC: 4.6 MIL/uL (ref 3.87–5.11)
RDW: 13.2 % (ref 11.5–15.5)
WBC: 6.9 10*3/uL (ref 4.0–10.5)
nRBC: 0 % (ref 0.0–0.2)

## 2023-05-28 LAB — MAGNESIUM: Magnesium: 1.8 mg/dL (ref 1.7–2.4)

## 2023-05-28 LAB — TROPONIN I (HIGH SENSITIVITY): Troponin I (High Sensitivity): 3 ng/L (ref <18)

## 2023-05-28 LAB — LIPASE, BLOOD: Lipase: 24 U/L (ref 11–51)

## 2023-05-28 MED ORDER — FAMOTIDINE IN NACL 20-0.9 MG/50ML-% IV SOLN
20.0000 mg | Freq: Once | INTRAVENOUS | Status: AC
  Administered 2023-05-28: 20 mg via INTRAVENOUS
  Filled 2023-05-28: qty 50

## 2023-05-28 MED ORDER — ALUM & MAG HYDROXIDE-SIMETH 200-200-20 MG/5ML PO SUSP
30.0000 mL | Freq: Once | ORAL | Status: AC
Start: 2023-05-28 — End: 2023-05-28
  Administered 2023-05-28: 30 mL via ORAL
  Filled 2023-05-28: qty 30

## 2023-05-28 MED ORDER — LIDOCAINE VISCOUS HCL 2 % MT SOLN
15.0000 mL | Freq: Once | OROMUCOSAL | Status: AC
  Administered 2023-05-28: 15 mL via ORAL
  Filled 2023-05-28: qty 15

## 2023-05-28 MED ORDER — PANTOPRAZOLE SODIUM 40 MG PO TBEC: 40.0000 mg | DELAYED_RELEASE_TABLET | Freq: Every day | ORAL | 0 refills | Status: DC

## 2023-05-28 NOTE — ED Provider Notes (Signed)
 [place] EMERGENCY DEPARTMENT AT [facility] Provider Note   CSN: [id number] Arrival date & time: 05/28/23  4782     History  Chief Complaint  Patient presents with   Chest Pain    Lindsey Ortega is a 63 y.o. female.   Chest Pain Associated symptoms: nausea   Patient presenting for chest pain.  Medical history includes seizures.  1 month ago, she had a flulike illness.  She did have some coughing and chest congestion at the time.  The symptoms have since resolved.  Over the past week, she has had intermittent chest pain.  At times, will be left of sternal border.  At other times will be right sided.  She notices that the pain is typically worse in the morning when she wakes up.  She will have worsened pain and nausea after eating.  Pain is described as a burning sensation.  She was seen in the ED 5 days ago for similar symptoms.  At that time, she had normal troponins and normal electrolytes on lab work.  Chest x-ray was negative.  Due to her ongoing intermittent symptoms, patient returns to the ED today.  Currently, she denies nausea.  She has slight chest discomfort at this time.  It was worse earlier this morning     Home Medications Prior to Admission medications   Medication Sig Start Date End Date Taking? Authorizing Provider  pantoprazole (PROTONIX) 40 MG tablet Take 1 tablet (40 mg total) by mouth daily. 05/28/23  Yes Gloris Manchester, MD  cyclobenzaprine (FLEXERIL) 10 MG tablet Take 1 tablet (10 mg total) by mouth 2 (two) times daily as needed for muscle spasms. 04/28/20   Gwyneth Sprout, MD  diphenhydramine-acetaminophen (TYLENOL PM EXTRA STRENGTH) 25-500 MG TABS tablet Take 1 tablet by mouth at bedtime as needed (pain/sleep/headache).    [provider]  naproxen (NAPROSYN) 375 MG tablet Take 1 tablet (375 mg total) by mouth 2 (two) times daily. 04/28/20   Gwyneth Sprout, MD  ondansetron (ZOFRAN) 4 MG tablet Take 1 tablet (4 mg total) by mouth every 6  (six) hours. 05/23/23   Lunette Stands, PA-C      Allergies    Percocet [oxycodone-acetaminophen] and Latex    Review of Systems   Review of Systems  Cardiovascular:  Positive for chest pain.  Gastrointestinal:  Positive for nausea.  All other systems reviewed and are negative.   Physical Exam Updated Vital Signs BP 133/76   Pulse 65   Temp 98.1 F (36.7 C) (Oral)   Resp 14   Ht 5' 5.5" (1.664 m)   Wt 56.7 kg   LMP 09/03/2017   SpO2 100%   BMI 20.48 kg/m  Physical Exam Vitals and nursing note reviewed.  Constitutional:      General: She is not in acute distress.    Appearance: She is well-developed. She is not ill-appearing, toxic-appearing or diaphoretic.  HENT:     Head: Normocephalic and atraumatic.  Eyes:     Conjunctiva/sclera: Conjunctivae normal.  Cardiovascular:     Rate and Rhythm: Normal rate and regular rhythm.     Heart sounds: No murmur heard. Pulmonary:     Effort: Pulmonary effort is normal. No tachypnea or respiratory distress.     Breath sounds: Normal breath sounds.  Chest:     Chest wall: Tenderness present.  Abdominal:     Palpations: Abdomen is soft.     Tenderness: There is no abdominal tenderness.  Musculoskeletal:  General: No swelling. Normal range of motion.     Cervical back: Normal range of motion and neck supple.  Skin:    General: Skin is warm and dry.     Coloration: Skin is not cyanotic or pale.  Neurological:     General: No focal deficit present.     Mental Status: She is alert and oriented to person, place, and time.  Psychiatric:        Mood and Affect: Mood normal.        Behavior: Behavior normal.     ED Results / Procedures / Treatments   Labs (all labs ordered are listed, but only abnormal results are displayed) Labs Reviewed  COMPREHENSIVE METABOLIC PANEL  MAGNESIUM  LIPASE, BLOOD  CBC WITH DIFFERENTIAL/PLATELET  TROPONIN I (HIGH SENSITIVITY)  TROPONIN I (HIGH SENSITIVITY)    EKG EKG  Interpretation Date/Time:  Tuesday May 28 2023 07:20:40 EDT Ventricular Rate:  90 PR Interval:  166 QRS Duration:  78 QT Interval:  357 QTC Calculation: 437 R Axis:   76  Text Interpretation: Sinus rhythm Confirmed by Gloris Manchester ([id number]) on 05/28/2023 8:12:44 AM  Radiology No results found.  Procedures Procedures    Medications Ordered in ED Medications  alum & mag hydroxide-simeth (MAALOX/MYLANTA) 200-200-20 MG/5ML suspension 30 mL (30 mLs Oral Given 05/28/23 0922)    And  lidocaine (XYLOCAINE) 2 % viscous mouth solution 15 mL (15 mLs Oral Given 05/28/23 0922)  famotidine (PEPCID) IVPB 20 mg premix (0 mg Intravenous Stopped 05/28/23 1000)    ED Course/ Medical Decision Making/ A&P                                 Medical Decision Making Amount and/or Complexity of Data Reviewed Labs: ordered.  Risk OTC drugs. Prescription drug management.   This patient presents to the ED for concern of chest pain, this involves an extensive number of treatment options, and is a complaint that carries with it a high risk of complications and morbidity.  The differential diagnosis includes ACS, GERD, costochondritis, other musculoskeletal etiology   Co morbidities that complicate the patient evaluation  Seizures   Additional history obtained:  Additional history obtained from N/A External records from outside source obtained and reviewed including EMR   Lab Tests:  I Ordered, and personally interpreted labs.  The pertinent results include: Normal hemoglobin, no leukocytosis, normal kidney function, normal electrolytes, normal lipase, normal troponin   Cardiac Monitoring: / EKG:  The patient was maintained on a cardiac monitor.  I personally viewed and interpreted the cardiac monitored which showed an underlying rhythm of: Sinus rhythm  Problem List / ED Course / Critical interventions / Medication management  Patient presenting for intermittent chest pain and nausea over the  past week.  She was seen in the ED last week with reassuring workup.  Symptoms have been intermittent and ongoing.  Vital signs on arrival are normal.  Patient is well-appearing on exam.  Her breathing is unlabored.  She endorses some mild discomfort at this time without nausea.  She does have some reproducible chest pain just left of sternal border.  There may be a musculoskeletal etiology given her recent flulike illness and coughing.  Patient also describes symptoms that worsened postprandially.  This suggests a reflux component.  GI cocktail was ordered in the ED.  Will repeat lab work today.  Lab work is all unremarkable.  Patient did have improved  symptoms after GI cocktail.  PPI was prescribed.  Patient was advised to continue daily PPI and to take over-the-counter Maalox as needed.  Cardiology referral was also ordered given her age.  Patient was discharged in stable condition. I ordered medication including GI cocktail for chest discomfort Reevaluation of the patient after these medicines showed that the patient improved I have reviewed the patients home medicines and have made adjustments as needed   Social Determinants of Health:  Lives independently        Final Clinical Impression(s) / ED Diagnoses Final diagnoses:  Chest pain, unspecified type    Rx / DC Orders ED Discharge Orders          Ordered    pantoprazole (PROTONIX) 40 MG tablet  Daily        05/28/23 1134    Ambulatory referral to Cardiology       Comments: If you have not heard from the Cardiology office within the next 72 hours please call [phone].   05/28/23 1141              Gloris Manchester, MD 05/28/23 1142

## 2023-05-28 NOTE — ED Triage Notes (Signed)
 Patient is here for evaluation of chest pain, dizziness and nausea. Reports was seen last week for the same, but is still having pain.

## 2023-05-28 NOTE — Discharge Instructions (Addendum)
 Test results today are reassuring.  A prescription for medication called pantoprazole was sent to your pharmacy.  This lowers acid content in the stomach and can help with symptoms of reflux.  Take for the next 30 days.  If you feel like this improves your symptoms, talk to your primary care doctor about long-term use.  You can also take over-the-counter Maalox as needed if you have pain or nausea, particularly after eating.  Additionally, a referral to the cardiology office was sent.  You should hear from them in the next couple days to set up an appointment to establish care.  If you do not hear from them, call the number below to set up that appointment.  Return to the emergency department for any new or worsening symptoms of concern.

## 2023-10-29 ENCOUNTER — Encounter (HOSPITAL_COMMUNITY): Payer: Self-pay | Admitting: Emergency Medicine

## 2023-10-29 ENCOUNTER — Emergency Department (HOSPITAL_COMMUNITY)
Admission: EM | Admit: 2023-10-29 | Discharge: 2023-10-29 | Disposition: A | Discharge status: Home / Self Care | Payer: Self-pay | Attending: Emergency Medicine | Admitting: Emergency Medicine

## 2023-10-29 ENCOUNTER — Other Ambulatory Visit: Payer: Self-pay

## 2023-10-29 ENCOUNTER — Emergency Department (HOSPITAL_COMMUNITY): Payer: Self-pay

## 2023-10-29 DIAGNOSIS — M542 Cervicalgia: Secondary | ICD-10-CM | POA: Insufficient documentation

## 2023-10-29 DIAGNOSIS — H8111 Benign paroxysmal vertigo, right ear: Secondary | ICD-10-CM | POA: Insufficient documentation

## 2023-10-29 DIAGNOSIS — Z91040 Latex allergy status: Secondary | ICD-10-CM | POA: Insufficient documentation

## 2023-10-29 LAB — CBC
HCT: 41.1 % (ref 36.0–46.0)
Hemoglobin: 13.1 g/dL (ref 12.0–15.0)
MCH: 30 pg (ref 26.0–34.0)
MCHC: 31.9 g/dL (ref 30.0–36.0)
MCV: 94.1 fL (ref 80.0–100.0)
Platelets: 339 K/uL (ref 150–400)
RBC: 4.37 MIL/uL (ref 3.87–5.11)
RDW: 13.3 % (ref 11.5–15.5)
WBC: 7.1 K/uL (ref 4.0–10.5)
nRBC: 0 % (ref 0.0–0.2)

## 2023-10-29 LAB — URINALYSIS, ROUTINE W REFLEX MICROSCOPIC
Bilirubin Urine: NEGATIVE
Glucose, UA: NEGATIVE mg/dL
Ketones, ur: NEGATIVE mg/dL
Nitrite: NEGATIVE
Protein, ur: NEGATIVE mg/dL
Specific Gravity, Urine: 1.017 (ref 1.005–1.030)
pH: 6 (ref 5.0–8.0)

## 2023-10-29 LAB — COMPREHENSIVE METABOLIC PANEL WITH GFR
ALT: 12 U/L (ref 0–44)
AST: 17 U/L (ref 15–41)
Albumin: 3.8 g/dL (ref 3.5–5.0)
Alkaline Phosphatase: 80 U/L (ref 38–126)
Anion gap: 7 (ref 5–15)
BUN: 16 mg/dL (ref 8–23)
CO2: 26 mmol/L (ref 22–32)
Calcium: 9.3 mg/dL (ref 8.9–10.3)
Chloride: 103 mmol/L (ref 98–111)
Creatinine, Ser: 0.79 mg/dL (ref 0.44–1.00)
GFR, Estimated: >60 mL/min (ref >60)
Glucose, Bld: 98 mg/dL (ref 70–99)
Potassium: 3.6 mmol/L (ref 3.5–5.1)
Sodium: 136 mmol/L (ref 135–145)
Total Bilirubin: 0.7 mg/dL (ref 0.0–1.2)
Total Protein: 8.2 g/dL — ABNORMAL HIGH (ref 6.5–8.1)

## 2023-10-29 LAB — TROPONIN I (HIGH SENSITIVITY): Troponin I (High Sensitivity): 2 ng/L (ref <18)

## 2023-10-29 MED ORDER — MECLIZINE HCL 25 MG PO TABS
25.0000 mg | ORAL_TABLET | Freq: Three times a day (TID) | ORAL | 0 refills | Status: AC | PRN
Start: 2023-10-29 — End: ?

## 2023-10-29 MED ORDER — IOHEXOL 350 MG/ML SOLN
75.0000 mL | Freq: Once | INTRAVENOUS | Status: AC | PRN
  Administered 2023-10-29 (×2): 75 mL via INTRAVENOUS

## 2023-10-29 MED ORDER — SODIUM CHLORIDE (PF) 0.9 % IJ SOLN
INTRAMUSCULAR | Status: AC
  Filled 2023-10-29: qty 50

## 2023-10-29 NOTE — Discharge Instructions (Addendum)
 You are seen emergency department today for concerns of dizziness and neck pain.  Your labs and imaging were thankfully reassuring.  I suspect likely have a condition called BPPV, or benign paroxysmal positional vertigo.  I am starting on a medication called meclizine  to help with your dizziness.  Please take this as prescribed.  For any concerns of new or worsening symptoms, return to the emergency department.  Otherwise, please set up care with a primary care provider for further evaluation and follow-up with an ENT if your symptoms are not improving.

## 2023-10-29 NOTE — ED Notes (Signed)
 RN called lab for f/u on troponin, they are running sample now

## 2023-10-29 NOTE — ED Notes (Signed)
 RN called lab to have troponin added

## 2023-10-29 NOTE — ED Provider Notes (Signed)
 [place] EMERGENCY DEPARTMENT AT [facility] Provider Note   CSN: [id number] Arrival date & time: 10/29/23  9444     Patient presents with: Neck Pain and Dizziness   Lindsey Ortega is a 63 y.o. female.  Patient with past history significant for seizures presents to the emergency department today with concerns of neck pain and dizziness.  Reports has been having generalized neck pain around her entire neck for the last week with associated dizziness.  She states that dizziness typically worsens when she tries to get up out of bed.  Rotating neck and looking side-to-side does not typically worsen her dizziness.  Denies any recent injury or trauma.  No history of BPPV or central cause of vertigo.  Denies tinnitus.   Neck Pain Dizziness      Prior to Admission medications   Medication Sig Start Date End Date Taking? Authorizing Provider  meclizine  (ANTIVERT ) 25 MG tablet Take 1 tablet (25 mg total) by mouth 3 (three) times daily as needed for dizziness. 10/29/23  Yes Chaeli Judy A, PA-C  cyclobenzaprine  (FLEXERIL ) 10 MG tablet Take 1 tablet (10 mg total) by mouth 2 (two) times daily as needed for muscle spasms. 04/28/20   Doretha Folks, MD  diphenhydramine-acetaminophen  (TYLENOL  PM EXTRA STRENGTH) 25-500 MG TABS tablet Take 1 tablet by mouth at bedtime as needed (pain/sleep/headache).    [provider]  naproxen  (NAPROSYN ) 375 MG tablet Take 1 tablet (375 mg total) by mouth 2 (two) times daily. 04/28/20   Doretha Folks, MD  ondansetron  (ZOFRAN ) 4 MG tablet Take 1 tablet (4 mg total) by mouth every 6 (six) hours. 05/23/23   Bauer, Collin S, PA-C  pantoprazole  (PROTONIX ) 40 MG tablet Take 1 tablet (40 mg total) by mouth daily. 05/28/23   Melvenia Motto, MD    Allergies: Penicillin g, Percocet [oxycodone -acetaminophen ], and Latex    Review of Systems  Musculoskeletal:  Positive for neck pain.  Neurological:  Positive for dizziness.  All other systems reviewed  and are negative.   Updated Vital Signs BP 136/78 (BP Location: Right Arm)   Pulse (!) 56   Temp 98.1 F (36.7 C) (Oral)   Resp 18   LMP 09/03/2017   SpO2 100%   Physical Exam Vitals and nursing note reviewed.  Constitutional:      General: She is not in acute distress.    Appearance: She is well-developed.  HENT:     Head: Normocephalic and atraumatic.  Eyes:     Conjunctiva/sclera: Conjunctivae normal.  Neck:     Comments: Midline TTP along the cervical spine Cardiovascular:     Rate and Rhythm: Normal rate and regular rhythm.     Heart sounds: No murmur heard. Pulmonary:     Effort: Pulmonary effort is normal. No respiratory distress.     Breath sounds: Normal breath sounds.  Abdominal:     Palpations: Abdomen is soft.     Tenderness: There is no abdominal tenderness.  Musculoskeletal:        General: No swelling.     Cervical back: Neck supple. Tenderness present.  Skin:    General: Skin is warm and dry.     Capillary Refill: Capillary refill takes less than 2 seconds.  Neurological:     General: No focal deficit present.     Mental Status: She is alert and oriented to person, place, and time. Mental status is at baseline.     Cranial Nerves: No cranial nerve deficit.  Motor: No weakness.     Gait: Gait normal.     Comments: No appreciable nystagmus seen. Transition from lying to sitting induces dizziness.  Psychiatric:        Mood and Affect: Mood normal.     (all labs ordered are listed, but only abnormal results are displayed) Labs Reviewed  COMPREHENSIVE METABOLIC PANEL WITH GFR - Abnormal; Notable for the following components:      Result Value   Total Protein 8.2 (*)    All other components within normal limits  URINALYSIS, ROUTINE W REFLEX MICROSCOPIC - Abnormal; Notable for the following components:   Hgb urine dipstick SMALL (*)    Leukocytes,Ua MODERATE (*)    Bacteria, UA RARE (*)    All other components within normal limits  CBC   TROPONIN I (HIGH SENSITIVITY)  TROPONIN I (HIGH SENSITIVITY)    EKG: None  Radiology: CT ANGIO HEAD NECK W WO CM Result Date: 10/29/2023 Here is the generated report: EXAM: CTA Head and Neck without and with Intravenous Contrast. CT Head without Contrast. CLINICAL HISTORY: Vertigo, central. Neck pain x 1 week. States she also has been dizzy \\T \ nauseous when she wakes up and gets out of bed. Reports she feels fine lying down but when she gets up the dizziness begins, labs bypassed per ordering provider, hx of seizures in adolescence. TECHNIQUE: Axial CTA images of the head and neck performed with intravenous contrast. MIP reconstructed images were created and reviewed. Axial computed tomography images of the head/brain performed without intravenous contrast. Note: Per PQRS, the description of internal carotid artery percent stenosis, including 0 percent or normal exam, is based on [place] American Symptomatic Carotid Endarterectomy Trial (NASCET) criteria. Dose reduction technique was used including one or more of the following: automated exposure control, adjustment of mA and kV according to patient size, and/or iterative reconstruction. CONTRAST: With; 75 mL iohexol  (Omnipaque ) 350 mg/mL injection. COMPARISON: CT Head dated 04/17/2003. FINDINGS: CT HEAD: BRAIN: The brain appears normal. No acute intraparenchymal hemorrhage. No mass lesion. No CT evidence for acute territorial infarct. No midline shift or extra-axial collection. VENTRICLES: No hydrocephalus. ORBITS: The orbits are unremarkable. SINUSES AND MASTOIDS: The paranasal sinuses and mastoid air cells are clear. CTA NECK: COMMON CAROTID ARTERIES: The common carotid arteries are normal in caliber and unremarkable in appearance. 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 The left internal carotid artery is otherwise unremarkable.  Right: The right internal carotid artery is normal in caliber and unremarkable in appearance. VERTEBRAL ARTERIES: No significant stenosis. No dissection or occlusion. CTA HEAD: ANTERIOR CEREBRAL ARTERIES: The anterior cerebral arteries are primarily supplied via the left internal carotid artery. The right A1 segment is diminutive. No significant stenosis. No occlusion. No aneurysm. MIDDLE CEREBRAL ARTERIES: The middle cerebral arteries are normal in caliber. No significant stenosis. No occlusion. No aneurysm. POSTERIOR CEREBRAL ARTERIES: No significant stenosis. No occlusion. No aneurysm. BASILAR ARTERY: No significant stenosis. No occlusion. No aneurysm. OTHER: No evidence of large vessel occlusion, flow-limiting stenosis or aneurysm. SOFT TISSUES: No acute finding. No masses or lymphadenopathy. BONES: No acute osseous abnormality. IMPRESSION: 1. No acute intracranial findings. 2. No evidence of large vessel occlusion, flow-limiting stenosis, or aneurysm. 3. 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 Electronically signed by: evalene coho 10/29/2023 08:57 AM EDT RP Workstation: [id number]  Procedures   Medications Ordered in the ED  iohexol  (OMNIPAQUE ) 350 MG/ML injection 75 mL (75 mLs Intravenous Contrast Given 10/29/23 0813)                                    Medical Decision Making Amount and/or Complexity of Data Reviewed Labs: ordered. Radiology: ordered.  Risk Prescription drug management.   This patient presents to the ED for concern of neck pain, dizziness, this involves an extensive number of treatment options, and is a complaint that carries with it a high risk of complications and morbidity.  The differential diagnosis includes BPPV, migraine, carotid artery dissection, dehydration, ACS   Co morbidities that complicate the patient evaluation  Seizure disorder   Lab Tests:  I Ordered, and personally interpreted labs.  The  pertinent results include: CBC unremarkable, CMP unremarkable, UA showed leukocytes and bacteria but no nitrates and patient asymptomatic, troponin 2   Imaging Studies ordered:  I ordered imaging studies including CT angio head and neck  I independently visualized and interpreted imaging which showed negative for any acute findings I agree with the radiologist interpretation   Cardiac Monitoring: / EKG:  The patient was maintained on a cardiac monitor.  I personally viewed and interpreted the cardiac monitored which showed an underlying rhythm of: Sinus rhythm   Consultations Obtained:  I requested consultation with none,  and discussed lab and imaging findings as well as pertinent plan - they recommend: N/A   Problem List / ED Course / Critical interventions / Medication management  Patient with past history significant for seizure disorder presents emergency department with concerns of neck pain and dizziness.  Reports this has been ongoing for the last week.  States symptoms typically aggravated by changing from lying to sitting.  Denies recent injury or trauma.  No reported history of any cardiac abnormalities.  States she has had some slight burning discomfort in her chest with mild headache. On exam, patient well-appearing.  No appreciable nystagmus.  Positional changes does induce room spinning sensation. Patient's lab workup is reassuring with no abnormal finding seen.  Troponin normal at 3.  Given symptoms for greater than a week ago, do not feel that she requires a repeat troponin.  CT angio head and neck pending at this time. CT angio head and neck is negative for any acute findings.  A 1 to 2 mm ulcerated plaque in the medial wall of the left internal carotid artery was seen. On reevaluation, patient remained stable.  States that she has no symptoms at this time.  Again, appears that her symptoms are largely related to positional changes typically only present in the morning.   Will start patient on a course of meclizine  for attempted management of what I believe is likely BPPV given a HINTS peripheral exam. Encouraged close follow up with PCP once she establishes care and to see ENT if symptoms are not improving. She verbalized understanding and agreement and discharged home. I have reviewed the patients home medicines and have made adjustments as needed   Social Determinants of Health:  No primary care provider   Test / Admission - Considered:  Admission considered patient stable for outpatient follow-up.  Final diagnoses:  Benign paroxysmal positional vertigo of right ear  Neck pain    ED Discharge Orders          Ordered    meclizine  (ANTIVERT ) 25 MG tablet  3 times daily PRN        10/29/23 1106               Cecily Legrand DELENA DEVONNA 10/29/23 1107    Dreama Longs, MD 10/29/23 2350

## 2023-10-29 NOTE — ED Triage Notes (Signed)
 Pt reports neck pain x 1 week. States she also has been dizzy & nauseous when she wakes up and gets out of bed. Reports she feels fine lying down but when she gets up the dizziness begins

## 2024-02-09 ENCOUNTER — Emergency Department (HOSPITAL_COMMUNITY): Payer: Self-pay

## 2024-02-09 ENCOUNTER — Other Ambulatory Visit: Payer: Self-pay

## 2024-02-09 ENCOUNTER — Emergency Department (HOSPITAL_COMMUNITY)
Admission: EM | Admit: 2024-02-09 | Discharge: 2024-02-09 | Disposition: A | Discharge status: Home / Self Care | Payer: Self-pay | Attending: Emergency Medicine | Admitting: Emergency Medicine

## 2024-02-09 ENCOUNTER — Encounter (HOSPITAL_COMMUNITY): Payer: Self-pay | Admitting: *Deleted

## 2024-02-09 DIAGNOSIS — E876 Hypokalemia: Secondary | ICD-10-CM | POA: Insufficient documentation

## 2024-02-09 DIAGNOSIS — N390 Urinary tract infection, site not specified: Secondary | ICD-10-CM | POA: Insufficient documentation

## 2024-02-09 DIAGNOSIS — Z91040 Latex allergy status: Secondary | ICD-10-CM | POA: Insufficient documentation

## 2024-02-09 DIAGNOSIS — R110 Nausea: Secondary | ICD-10-CM

## 2024-02-09 DIAGNOSIS — D72829 Elevated white blood cell count, unspecified: Secondary | ICD-10-CM | POA: Insufficient documentation

## 2024-02-09 LAB — COMPREHENSIVE METABOLIC PANEL WITH GFR
ALT: 13 U/L (ref 0–44)
AST: 19 U/L (ref 15–41)
Albumin: 3.6 g/dL (ref 3.5–5.0)
Alkaline Phosphatase: 92 U/L (ref 38–126)
Anion gap: 11 (ref 5–15)
BUN: 12 mg/dL (ref 8–23)
CO2: 24 mmol/L (ref 22–32)
Calcium: 9.1 mg/dL (ref 8.9–10.3)
Chloride: 104 mmol/L (ref 98–111)
Creatinine, Ser: 0.94 mg/dL (ref 0.44–1.00)
GFR, Estimated: >60 mL/min (ref >60)
Glucose, Bld: 140 mg/dL — ABNORMAL HIGH (ref 70–99)
Potassium: 3.4 mmol/L — ABNORMAL LOW (ref 3.5–5.1)
Sodium: 139 mmol/L (ref 135–145)
Total Bilirubin: 0.6 mg/dL (ref 0.0–1.2)
Total Protein: 8 g/dL (ref 6.5–8.1)

## 2024-02-09 LAB — URINALYSIS, ROUTINE W REFLEX MICROSCOPIC
Bacteria, UA: NONE SEEN
Bilirubin Urine: NEGATIVE
Glucose, UA: NEGATIVE mg/dL
Hgb urine dipstick: NEGATIVE
Ketones, ur: 5 mg/dL — AB
Nitrite: NEGATIVE
Protein, ur: NEGATIVE mg/dL
Specific Gravity, Urine: 1.016 (ref 1.005–1.030)
pH: 6 (ref 5.0–8.0)

## 2024-02-09 LAB — CBC
HCT: 39.6 % (ref 36.0–46.0)
Hemoglobin: 13 g/dL (ref 12.0–15.0)
MCH: 30.4 pg (ref 26.0–34.0)
MCHC: 32.8 g/dL (ref 30.0–36.0)
MCV: 92.5 fL (ref 80.0–100.0)
Platelets: 396 K/uL (ref 150–400)
RBC: 4.28 MIL/uL (ref 3.87–5.11)
RDW: 13.5 % (ref 11.5–15.5)
WBC: 14.9 K/uL — ABNORMAL HIGH (ref 4.0–10.5)
nRBC: 0 % (ref 0.0–0.2)

## 2024-02-09 LAB — LIPASE, BLOOD: Lipase: 24 U/L (ref 11–51)

## 2024-02-09 LAB — SARS CORONAVIRUS 2 BY RT PCR: SARS Coronavirus 2 by RT PCR: NEGATIVE

## 2024-02-09 MED ORDER — ONDANSETRON HCL 4 MG/2ML IJ SOLN
4.0000 mg | Freq: Once | INTRAMUSCULAR | Status: AC
  Administered 2024-02-09: 4 mg via INTRAVENOUS
  Filled 2024-02-09: qty 2

## 2024-02-09 MED ORDER — SODIUM CHLORIDE 0.9 % IV BOLUS
1000.0000 mL | Freq: Once | INTRAVENOUS | Status: AC
  Administered 2024-02-09: 1000 mL via INTRAVENOUS

## 2024-02-09 MED ORDER — METOCLOPRAMIDE HCL 5 MG/ML IJ SOLN
10.0000 mg | Freq: Once | INTRAMUSCULAR | Status: AC
  Administered 2024-02-09: 10 mg via INTRAVENOUS
  Filled 2024-02-09: qty 2

## 2024-02-09 MED ORDER — IOHEXOL 350 MG/ML SOLN
75.0000 mL | Freq: Once | INTRAVENOUS | Status: AC | PRN
  Administered 2024-02-09: 75 mL via INTRAVENOUS

## 2024-02-09 MED ORDER — CEPHALEXIN 500 MG PO CAPS: 500.0000 mg | ORAL_CAPSULE | Freq: Two times a day (BID) | ORAL | 0 refills | Status: AC

## 2024-02-09 MED ORDER — METOCLOPRAMIDE HCL 10 MG PO TABS: 20.0000 mg | ORAL_TABLET | Freq: Three times a day (TID) | ORAL | 0 refills | Status: AC

## 2024-02-09 NOTE — ED Notes (Signed)
 Patient transported to CT

## 2024-02-09 NOTE — Discharge Instructions (Signed)
 As discussed, your evaluation today has been largely reassuring.  But, it is important that you monitor your condition carefully, and do not hesitate to return to the ED if you develop new, or concerning changes in your condition. ? ?Otherwise, please follow-up with your physician for appropriate ongoing care. ? ?

## 2024-02-09 NOTE — ED Provider Notes (Signed)
 [place] EMERGENCY DEPARTMENT AT [facility] Provider Note   CSN: [id number] Arrival date & time: 02/09/24  9374     Patient presents with: Nausea   Lindsey Ortega is a 63 y.o. female.   HPI Pt arrived with [facility] from home for NV x 5 days. 20g IV to left AC; appears generally weak and having difficulty sitting up in wheelchair. Denies pain.  Given 4mg  IV zofran . Intermittently dry heaving with white sputum   She denies focal pain, states that she was improving until overnight when she awoke with nausea, vomiting.  No focal weakness, no focal pain.    Prior to Admission medications   Medication Sig Start Date End Date Taking? Authorizing Provider  cyclobenzaprine  (FLEXERIL ) 10 MG tablet Take 1 tablet (10 mg total) by mouth 2 (two) times daily as needed for muscle spasms. 04/28/20   Doretha Folks, MD  diphenhydramine-acetaminophen  (TYLENOL  PM EXTRA STRENGTH) 25-500 MG TABS tablet Take 1 tablet by mouth at bedtime as needed (pain/sleep/headache).    [provider]  meclizine  (ANTIVERT ) 25 MG tablet Take 1 tablet (25 mg total) by mouth 3 (three) times daily as needed for dizziness. 10/29/23   Zelaya, Oscar A, PA-C  naproxen  (NAPROSYN ) 375 MG tablet Take 1 tablet (375 mg total) by mouth 2 (two) times daily. 04/28/20   Doretha Folks, MD  ondansetron  (ZOFRAN ) 4 MG tablet Take 1 tablet (4 mg total) by mouth every 6 (six) hours. 05/23/23   Beola Terrall RAMAN, PA-C  pantoprazole  (PROTONIX ) 40 MG tablet Take 1 tablet (40 mg total) by mouth daily. 05/28/23   Melvenia Motto, MD    Allergies: Penicillin g, Percocet [oxycodone -acetaminophen ], and Latex    Review of Systems  Updated Vital Signs BP (!) 142/90   Pulse 82   Temp 98.2 F (36.8 C) (Oral)   Resp 16   Ht 1.664 m (5' 5.5)   Wt 61.2 kg   LMP 09/03/2017   SpO2 100%   BMI 22.12 kg/m   Physical Exam Vitals and nursing note reviewed.  Constitutional:      General: She is not in acute distress.     Appearance: She is well-developed. She is ill-appearing. She is not toxic-appearing or diaphoretic.  HENT:     Head: Normocephalic and atraumatic.  Eyes:     Conjunctiva/sclera: Conjunctivae normal.  Cardiovascular:     Rate and Rhythm: Normal rate and regular rhythm.  Pulmonary:     Effort: Pulmonary effort is normal. No respiratory distress.     Breath sounds: Normal breath sounds. No stridor.  Abdominal:     Comments: Mild discomfort, diffuse  Skin:    General: Skin is warm and dry.  Neurological:     Mental Status: She is alert and oriented to person, place, and time.     Cranial Nerves: No cranial nerve deficit.  Psychiatric:        Mood and Affect: Mood normal.     (all labs ordered are listed, but only abnormal results are displayed) Labs Reviewed  COMPREHENSIVE METABOLIC PANEL WITH GFR - Abnormal; Notable for the following components:      Result Value   Potassium 3.4 (*)    Glucose, Bld 140 (*)    All other components within normal limits  CBC - Abnormal; Notable for the following components:   WBC 14.9 (*)    All other components within normal limits  URINALYSIS, ROUTINE W REFLEX MICROSCOPIC - Abnormal; Notable for the following components:  APPearance HAZY (*)    Ketones, ur 5 (*)    Leukocytes,Ua SMALL (*)    All other components within normal limits  SARS CORONAVIRUS 2 BY RT PCR  LIPASE, BLOOD    EKG: None  Radiology: DG Chest 2 View Result Date: 02/09/2024 EXAM: 2 VIEW(S) XRAY OF THE CHEST 02/09/2024 12:12:00 PM COMPARISON: 05/23/2023 CLINICAL HISTORY: cough, nausea, smoker FINDINGS: LUNGS AND PLEURA: Chronic eventration right hemidiaphragm. Nonspecific blunting of the left costophrenic angle. No focal pulmonary opacity. No pneumothorax. HEART AND MEDIASTINUM: No acute abnormality of the cardiac and mediastinal silhouettes. BONES AND SOFT TISSUES: No acute osseous abnormality. IMPRESSION: 1. No acute cardiopulmonary abnormality. Electronically signed by:  Morgane Naveau MD 02/09/2024 12:20 PM EST RP Workstation: [id number]   CT ABDOMEN PELVIS W CONTRAST Result Date: 02/09/2024 CLINICAL DATA:  Abdominal pain, acute, nonlocalized EXAM: CT ABDOMEN AND PELVIS WITH CONTRAST TECHNIQUE: Multidetector CT imaging of the abdomen and pelvis was performed using the standard protocol following bolus administration of intravenous contrast. RADIATION DOSE REDUCTION: This exam was performed according to the departmental dose-optimization program which includes automated exposure control, adjustment of the mA and/or kV according to patient size and/or use of iterative reconstruction technique. CONTRAST:  75mL OMNIPAQUE  IOHEXOL  350 MG/ML SOLN COMPARISON:  May 29, 2018 FINDINGS: Lower chest: No acute abnormality. Hepatobiliary: Gallbladder is unremarkable. No intrahepatic or extrahepatic biliary ductal dilation. A few scattered tiny subcentimeter hypodense masses are too small to accurately characterize. Pancreas: Unremarkable. No pancreatic ductal dilatation or surrounding inflammatory changes. Spleen: Normal in size without focal abnormality. Adrenals/Urinary Tract: Adrenal glands are unremarkable. Kidneys enhance symmetrically. No hydronephrosis. No obstructing nephrolithiasis. There is circumferential wall thickening with submucosal enhancement of the bladder. There is mild adjacent fat stranding. Stomach/Bowel: No evidence of bowel obstruction. Moderate rectal stool ball. Pancolonic diverticulosis most pronounced throughout the descending colon. Appendix is normal. Moderate to large hiatal hernia. Vascular/Lymphatic: Atherosclerotic calcifications of the nonaneurysmal abdominal aorta. Circumaortic LEFT renal vein. No suspicious lymphadenopathy. Reproductive: Status post surgical excision of previously described RIGHT ovarian mass which demonstrated a benign leiomyoma. No new mass is identified. Other: Fat containing inguinal hernias. Musculoskeletal: No acute or significant  osseous findings. IMPRESSION: 1. There is circumferential wall thickening with submucosal enhancement of the bladder. There is mild adjacent fat stranding. Findings are concerning for cystitis. Recommend correlation with urinalysis. 2. Moderate to large hiatal hernia. Aortic Atherosclerosis (ICD10-I70.0). Electronically Signed   By: Corean Salter M.D.   On: 02/09/2024 10:36     Procedures   Medications Ordered in the ED  ondansetron  (ZOFRAN ) injection 4 mg (4 mg Intravenous Given 02/09/24 0728)  sodium chloride  0.9 % bolus 1,000 mL (0 mLs Intravenous Stopped 02/09/24 1001)  iohexol  (OMNIPAQUE ) 350 MG/ML injection 75 mL (75 mLs Intravenous Contrast Given 02/09/24 1005)  sodium chloride  0.9 % bolus 1,000 mL (1,000 mLs Intravenous New Bag/Given 02/09/24 1217)  metoCLOPramide  (REGLAN ) injection 10 mg (10 mg Intravenous Given 02/09/24 1216)                                    Medical Decision Making Adult female presents with several days of nausea, vomiting.  Has no focal pain, no focal weakness, is afebrile, awake, alert, but uncomfortable appearance. No peritonitis, though intra-abdominal processes, viral infection, electrolyte disturbance, all other considerations. Cardiac 60 sinus normal pulse ox 100% room air normal  Amount and/or Complexity of Data Reviewed External Data Reviewed: notes.  Details: History of ovarian cyst, vertigo noted.  In addition, patient with ovarian versus reproductive lesion 5 years ago, CT Labs: ordered. Decision-making details documented in ED Course. Radiology: ordered and independent interpretation performed. Decision-making details documented in ED Course.  Risk Prescription drug management. Decision regarding hospitalization.  Update: Patient's vital signs remain unremarkable, initial labs generally noncontributory, possible urinary tract infection, x-ray, repeat assessment pending.  2:36 PM On repeat exam patient is in no distress, vital signs  stable, nausea seems to improved markedly, we had a pleasant conversation about all results, including x-ray, CT, labs.  Negative COVID, no intra-abdominal or intrathoracic substantial abnormalities, some evidence for urinary tract infection, though she has no urinary symptoms.  However, with nausea, and abnormal CT patient will start antibiotics, antiemetics, will follow-up with primary care.  Final diagnoses:  Nausea  Lower urinary tract infectious disease   ED Discharge Orders          Ordered    cephALEXin  (KEFLEX ) 500 MG capsule  2 times daily        02/09/24 1438    metoCLOPramide  (REGLAN ) 10 MG tablet  3 times daily before meals & bedtime        02/09/24 1438               Garrick Charleston, MD 02/09/24 1438

## 2024-02-09 NOTE — ED Notes (Signed)
 Patient transported to X-ray

## 2024-02-09 NOTE — ED Notes (Signed)
 Pt wheeled from ed . Pt is ambulatory at time of discharge. Pt called family to drive home.

## 2024-02-09 NOTE — ED Triage Notes (Addendum)
 Pt arrived with [facility] from home for NV x 5 days. 20g IV to left AC; appears generally weak and having difficulty sitting up in wheelchair. Denies pain.  Given 4mg  IV zofran . Intermittently dry heaving with white sputum

## 2024-03-10 ENCOUNTER — Other Ambulatory Visit: Payer: Self-pay

## 2024-03-10 ENCOUNTER — Emergency Department (HOSPITAL_COMMUNITY): Payer: Self-pay

## 2024-03-10 ENCOUNTER — Emergency Department (HOSPITAL_COMMUNITY)
Admission: EM | Admit: 2024-03-10 | Discharge: 2024-03-10 | Disposition: A | Discharge status: Home / Self Care | Payer: Self-pay | Attending: Emergency Medicine | Admitting: Emergency Medicine

## 2024-03-10 ENCOUNTER — Encounter (HOSPITAL_COMMUNITY): Payer: Self-pay | Admitting: Emergency Medicine

## 2024-03-10 DIAGNOSIS — R519 Headache, unspecified: Secondary | ICD-10-CM | POA: Insufficient documentation

## 2024-03-10 DIAGNOSIS — Z91040 Latex allergy status: Secondary | ICD-10-CM | POA: Insufficient documentation

## 2024-03-10 DIAGNOSIS — R110 Nausea: Secondary | ICD-10-CM | POA: Insufficient documentation

## 2024-03-10 DIAGNOSIS — R079 Chest pain, unspecified: Secondary | ICD-10-CM | POA: Insufficient documentation

## 2024-03-10 DIAGNOSIS — R42 Dizziness and giddiness: Secondary | ICD-10-CM | POA: Insufficient documentation

## 2024-03-10 DIAGNOSIS — R059 Cough, unspecified: Secondary | ICD-10-CM | POA: Insufficient documentation

## 2024-03-10 LAB — BASIC METABOLIC PANEL WITH GFR
Anion gap: 10 (ref 5–15)
BUN: 10 mg/dL (ref 8–23)
CO2: 25 mmol/L (ref 22–32)
Calcium: 9.4 mg/dL (ref 8.9–10.3)
Chloride: 102 mmol/L (ref 98–111)
Creatinine, Ser: 0.75 mg/dL (ref 0.44–1.00)
GFR, Estimated: >60 mL/min
Glucose, Bld: 93 mg/dL (ref 70–99)
Potassium: 3.7 mmol/L (ref 3.5–5.1)
Sodium: 138 mmol/L (ref 135–145)

## 2024-03-10 LAB — CBC
HCT: 38.3 % (ref 36.0–46.0)
Hemoglobin: 12.6 g/dL (ref 12.0–15.0)
MCH: 30.7 pg (ref 26.0–34.0)
MCHC: 32.9 g/dL (ref 30.0–36.0)
MCV: 93.4 fL (ref 80.0–100.0)
Platelets: 327 K/uL (ref 150–400)
RBC: 4.1 MIL/uL (ref 3.87–5.11)
RDW: 13.4 % (ref 11.5–15.5)
WBC: 7.9 K/uL (ref 4.0–10.5)
nRBC: 0 % (ref 0.0–0.2)

## 2024-03-10 LAB — HEPATIC FUNCTION PANEL
ALT: 10 U/L (ref 0–44)
AST: 17 U/L (ref 15–41)
Albumin: 4.2 g/dL (ref 3.5–5.0)
Alkaline Phosphatase: 96 U/L (ref 38–126)
Bilirubin, Direct: 0.1 mg/dL (ref 0.0–0.2)
Indirect Bilirubin: 0.3 mg/dL (ref 0.3–0.9)
Total Bilirubin: 0.4 mg/dL (ref 0.0–1.2)
Total Protein: 8 g/dL (ref 6.5–8.1)

## 2024-03-10 LAB — RESP PANEL BY RT-PCR (RSV, FLU A&B, COVID)  RVPGX2
Influenza A by PCR: NEGATIVE
Influenza B by PCR: NEGATIVE
Resp Syncytial Virus by PCR: NEGATIVE
SARS Coronavirus 2 by RT PCR: NEGATIVE

## 2024-03-10 LAB — TROPONIN T, HIGH SENSITIVITY
Troponin T High Sensitivity: <15 ng/L (ref 0–19)
Troponin T High Sensitivity: <15 ng/L (ref 0–19)

## 2024-03-10 LAB — LIPASE, BLOOD: Lipase: 17 U/L (ref 11–51)

## 2024-03-10 MED ORDER — ONDANSETRON HCL 4 MG/2ML IJ SOLN
4.0000 mg | Freq: Once | INTRAMUSCULAR | Status: AC
  Administered 2024-03-10: 4 mg via INTRAVENOUS
  Filled 2024-03-10: qty 2

## 2024-03-10 MED ORDER — SODIUM CHLORIDE 0.9 % IV BOLUS
1000.0000 mL | Freq: Once | INTRAVENOUS | Status: AC
  Administered 2024-03-10: 1000 mL via INTRAVENOUS

## 2024-03-10 MED ORDER — ONDANSETRON 4 MG PO TBDP: 4.0000 mg | ORAL_TABLET | Freq: Three times a day (TID) | ORAL | 0 refills | Status: AC | PRN

## 2024-03-10 MED ORDER — BENZONATATE 100 MG PO CAPS: 100.0000 mg | ORAL_CAPSULE | Freq: Three times a day (TID) | ORAL | 0 refills | Status: AC

## 2024-03-10 NOTE — Discharge Instructions (Signed)
 It was a pleasure taking care of you today. As discussed, your labs are reassuring. I am sending you home with nausea medication. Take as needed for nausea. I have included the number of the GI doctor. Call to schedule an appointment for further evaluation. Return to the ER for new or worsening symptoms.

## 2024-03-10 NOTE — ED Provider Notes (Addendum)
 " [place] EMERGENCY DEPARTMENT AT [facility] Provider Note   CSN: [id number] Arrival date & time: 03/10/24  1653     Patient presents with: Chest Pain, Headache, and Cough   Lindsey Ortega is a 63 y.o. female with a past medical history significant for seizures who presents to the ED due to multiple complaints of chest pain, cough, dizziness, and headaches. Also admits to frequent nausea.   Patient admits to frequent nausea after eating x 2 weeks.  No emesis.  Denies diarrhea.  Denies abdominal pain.  No previous abdominal operations. Denies urinary and vaginal symptoms.  Patient also admits to intermittent CP. She notes it starts on the right side then radiates to left side. Denies associated shortness of breath.  No lower extremity edema.  Denies history of blood clots, recent surgeries or recent long immobilizations, and hormonal treatments.  No cardiac history.  Also admits to intermittent lightheadedness.  Denies room spinning sensation and feeling off balance.  Denies visual and speech changes.  No unilateral weakness.  Endorses intermittent headaches for the past 2 weeks. No history of CVA.   History obtained from patient and past medical records. No interpreter used during encounter.       Prior to Admission medications  Medication Sig Start Date End Date Taking? Authorizing Provider  ondansetron  (ZOFRAN -ODT) 4 MG disintegrating tablet Take 1 tablet (4 mg total) by mouth every 8 (eight) hours as needed. 03/10/24  Yes Leonor Darnell, Aleck BROCKS, PA-C  cyclobenzaprine  (FLEXERIL ) 10 MG tablet Take 1 tablet (10 mg total) by mouth 2 (two) times daily as needed for muscle spasms. 04/28/20   Doretha Folks, MD  diphenhydramine-acetaminophen  (TYLENOL  PM EXTRA STRENGTH) 25-500 MG TABS tablet Take 1 tablet by mouth at bedtime as needed (pain/sleep/headache).    [provider]  meclizine  (ANTIVERT ) 25 MG tablet Take 1 tablet (25 mg total) by mouth 3 (three) times daily  as needed for dizziness. 10/29/23   Zelaya, Oscar A, PA-C  metoCLOPramide  (REGLAN ) 10 MG tablet Take 2 tablets (20 mg total) by mouth 4 (four) times daily -  before meals and at bedtime. 02/09/24   Garrick Charleston, MD  naproxen  (NAPROSYN ) 375 MG tablet Take 1 tablet (375 mg total) by mouth 2 (two) times daily. 04/28/20   Doretha Folks, MD  ondansetron  (ZOFRAN ) 4 MG tablet Take 1 tablet (4 mg total) by mouth every 6 (six) hours. 05/23/23   Bauer, Collin S, PA-C  pantoprazole  (PROTONIX ) 40 MG tablet Take 1 tablet (40 mg total) by mouth daily. 05/28/23   Melvenia Motto, MD    Allergies: Penicillin g, Percocet [oxycodone -acetaminophen ], and Latex    Review of Systems  Constitutional:  Negative for fever.  Respiratory:  Positive for cough. Negative for shortness of breath.   Cardiovascular:  Positive for chest pain. Negative for leg swelling.  Gastrointestinal:  Positive for nausea. Negative for abdominal pain, diarrhea and vomiting.  Neurological:  Positive for light-headedness.    Updated Vital Signs BP (!) 160/88   Pulse 85   Temp 98.1 F (36.7 C) (Oral)   Resp 18   LMP 09/03/2017   SpO2 99%   Physical Exam Vitals and nursing note reviewed.  Constitutional:      General: She is not in acute distress.    Appearance: She is not ill-appearing.  HENT:     Head: Normocephalic.  Eyes:     Pupils: Pupils are equal, round, and reactive to light.  Cardiovascular:     Rate and  Rhythm: Normal rate and regular rhythm.     Pulses: Normal pulses.     Heart sounds: Normal heart sounds. No murmur heard.    No friction rub. No gallop.  Pulmonary:     Effort: Pulmonary effort is normal.     Breath sounds: Normal breath sounds.  Abdominal:     General: Abdomen is flat. There is no distension.     Palpations: Abdomen is soft.     Tenderness: There is no abdominal tenderness. There is no guarding or rebound.  Musculoskeletal:        General: Normal range of motion.     Cervical back: Neck  supple.  Skin:    General: Skin is warm and dry.  Neurological:     General: No focal deficit present.     Mental Status: She is alert.  Psychiatric:        Mood and Affect: Mood normal.        Behavior: Behavior normal.     (all labs ordered are listed, but only abnormal results are displayed) Labs Reviewed  RESP PANEL BY RT-PCR (RSV, FLU A&B, COVID)  [id number]  BASIC METABOLIC PANEL WITH GFR  CBC  HEPATIC FUNCTION PANEL  LIPASE, BLOOD  TROPONIN T, HIGH SENSITIVITY  TROPONIN T, HIGH SENSITIVITY    EKG: EKG Interpretation Date/Time:  Tuesday March 10 2024 17:09:34 EST Ventricular Rate:  64 PR Interval:  180 QRS Duration:  82 QT Interval:  383 QTC Calculation: 396 R Axis:   62  Text Interpretation: Sinus rhythm Nonspecific T wave abnormality Confirmed by Bernard Drivers ([id number]) on 03/10/2024 9:24:13 PM  Radiology: [facility] Chest 2 View Result Date: 03/10/2024 CLINICAL DATA:  Chest pain EXAM: CHEST - 2 VIEW COMPARISON:  02/09/2024 FINDINGS: The heart size and mediastinal contours are within normal limits. Both lungs are clear. The visualized skeletal structures are unremarkable. Minimal scarring or atelectasis at the lingula. IMPRESSION: No active cardiopulmonary disease. Electronically Signed   By: Luke Bun M.D.   On: 03/10/2024 18:59     Procedures   Medications Ordered in the ED  sodium chloride  0.9 % bolus 1,000 mL (1,000 mLs Intravenous New Bag/Given 03/10/24 2214)  ondansetron  (ZOFRAN ) injection 4 mg (4 mg Intravenous Given 03/10/24 2212)                                    Medical Decision Making Amount and/or Complexity of Data Reviewed External Data Reviewed: notes. Labs: ordered. Decision-making details documented in ED Course. Radiology: ordered and independent interpretation performed. Decision-making details documented in ED Course. ECG/medicine tests: ordered and independent interpretation performed. Decision-making details documented in ED  Course.  Risk Prescription drug management.   This patient presents to the ED for concern of CP, Nausea, lightheadedness, this involves an extensive number of treatment options, and is a complaint that carries with it a high risk of complications and morbidity.  The differential diagnosis includes ACS, PE, dissection, PNA, CVA, electrolyte abnormality, etc  63 year old female presents to the ED due to multiple complaints of chest pain, cough, lightheadedness, headache, nausea.  Notes symptoms started roughly 2 weeks ago.  No cardiac history.  Denies speech or visual changes.  Denies unilateral weakness.  Upon arrival patient afebrile, not tachycardic or hypoxic.  Patient well-appearing on exam.  Normal neurological exam with no neurological deficits.  Low suspicion for CVA.  It appears patient's dizziness is more lightheadedness.  Also  endorses intermittent nausea after eating.  Abdomen soft, nondistended with very mild tenderness.  Negative Murphy sign.  Low suspicion for acute cholecystitis.  Routine labs ordered.  Lipase rule out evidence of pancreatitis.  Also endorses some intermittent chest pain x 2 weeks.  Cardiac labs ordered to rule out ACS.  Low suspicion for PE/DVT.  Presentation not concerning for aortic dissection.  IV fluids and Zofran  given.  CBC unremarkable.  No leukocytosis.  BMP unremarkable.  Normal renal function.  No major electrolyte derangements.  Troponin normal.  EKG normal sinus rhythm.  No signs of acute ischemia.  Low suspicion for ACS. CXR negative. Hepatic function panel normal. Lipase normal. Low suspicion for pancreatitis.  Upon reassessment, patient notes improvement in symptoms.  Patient able to ambulate without difficulty.  No neurological deficits.  Low suspicion for CVA.  CT head negative. Abdomen soft, nondistended, nontender upon recheck.  Low suspicion for acute abdomen. GI number given to patient at discharge and advised to call to schedule an appointment for  further evaluation of nausea. Patient discharged with Zofran . Strict ED precautions discussed with patient. Patient states understanding and agrees to plan. Patient discharged home in no acute distress and stable vitals  Co morbidities that complicate the patient evaluation  seizures Cardiac Monitoring: / EKG:  The patient was maintained on a cardiac monitor.  I personally viewed and interpreted the cardiac monitored which showed an underlying rhythm of: NSR  Social Determinants of Health:  No PCP  Test / Admission - Considered:  Considered admission however, reassuring workup.  Felt patient is stable for outpatient follow-up with GI for nausea.  Patient referred to PCP.     Final diagnoses:  Nausea  Lightheadedness  Nonspecific chest pain    ED Discharge Orders          Ordered    ondansetron  (ZOFRAN -ODT) 4 MG disintegrating tablet  Every 8 hours PRN        03/10/24 2255               [place] C, PA-C 03/10/24 2306    Jermiah Soderman C, PA-C 03/10/24 [id number]    Bernard Drivers, MD 03/14/24 1309  "

## 2024-03-10 NOTE — ED Triage Notes (Signed)
 Pt reports chest pain, cough, dizziness and headaches x 1 month. Reports she has been seen for same at [facility].

## 2024-04-11 ENCOUNTER — Emergency Department (HOSPITAL_COMMUNITY): Payer: Self-pay

## 2024-04-11 ENCOUNTER — Encounter (HOSPITAL_COMMUNITY): Payer: Self-pay

## 2024-04-11 ENCOUNTER — Other Ambulatory Visit: Payer: Self-pay

## 2024-04-11 ENCOUNTER — Emergency Department (HOSPITAL_COMMUNITY)
Admission: EM | Admit: 2024-04-11 | Discharge: 2024-04-11 | Disposition: A | Discharge status: Home / Self Care | Payer: Self-pay | Attending: Emergency Medicine | Admitting: Emergency Medicine

## 2024-04-11 DIAGNOSIS — R202 Paresthesia of skin: Secondary | ICD-10-CM

## 2024-04-11 DIAGNOSIS — R0789 Other chest pain: Secondary | ICD-10-CM | POA: Insufficient documentation

## 2024-04-11 DIAGNOSIS — R109 Unspecified abdominal pain: Secondary | ICD-10-CM

## 2024-04-11 DIAGNOSIS — R42 Dizziness and giddiness: Secondary | ICD-10-CM | POA: Insufficient documentation

## 2024-04-11 DIAGNOSIS — R1011 Right upper quadrant pain: Secondary | ICD-10-CM | POA: Insufficient documentation

## 2024-04-11 DIAGNOSIS — Z91040 Latex allergy status: Secondary | ICD-10-CM | POA: Insufficient documentation

## 2024-04-11 DIAGNOSIS — R200 Anesthesia of skin: Secondary | ICD-10-CM | POA: Insufficient documentation

## 2024-04-11 DIAGNOSIS — M79601 Pain in right arm: Secondary | ICD-10-CM | POA: Insufficient documentation

## 2024-04-11 LAB — CBC
HCT: 40.4 % (ref 36.0–46.0)
Hemoglobin: 13.6 g/dL (ref 12.0–15.0)
MCH: 31 pg (ref 26.0–34.0)
MCHC: 33.7 g/dL (ref 30.0–36.0)
MCV: 92 fL (ref 80.0–100.0)
Platelets: 351 10*3/uL (ref 150–400)
RBC: 4.39 MIL/uL (ref 3.87–5.11)
RDW: 13.6 % (ref 11.5–15.5)
WBC: 5.4 10*3/uL (ref 4.0–10.5)
nRBC: 0 % (ref 0.0–0.2)

## 2024-04-11 LAB — URINALYSIS, ROUTINE W REFLEX MICROSCOPIC
Bilirubin Urine: NEGATIVE
Glucose, UA: NEGATIVE mg/dL
Hgb urine dipstick: NEGATIVE
Ketones, ur: 5 mg/dL — AB
Nitrite: NEGATIVE
Protein, ur: NEGATIVE mg/dL
Specific Gravity, Urine: 1.02 (ref 1.005–1.030)
pH: 6 (ref 5.0–8.0)

## 2024-04-11 LAB — COMPREHENSIVE METABOLIC PANEL WITH GFR
ALT: 7 U/L (ref 0–44)
AST: 19 U/L (ref 15–41)
Albumin: 4.1 g/dL (ref 3.5–5.0)
Alkaline Phosphatase: 101 U/L (ref 38–126)
Anion gap: 9 (ref 5–15)
BUN: 7 mg/dL — ABNORMAL LOW (ref 8–23)
CO2: 26 mmol/L (ref 22–32)
Calcium: 9.6 mg/dL (ref 8.9–10.3)
Chloride: 106 mmol/L (ref 98–111)
Creatinine, Ser: 0.79 mg/dL (ref 0.44–1.00)
GFR, Estimated: >60 mL/min
Glucose, Bld: 134 mg/dL — ABNORMAL HIGH (ref 70–99)
Potassium: 3.7 mmol/L (ref 3.5–5.1)
Sodium: 141 mmol/L (ref 135–145)
Total Bilirubin: 0.5 mg/dL (ref 0.0–1.2)
Total Protein: 7.8 g/dL (ref 6.5–8.1)

## 2024-04-11 LAB — TROPONIN T, HIGH SENSITIVITY: Troponin T High Sensitivity: <6 ng/L (ref 0–19)

## 2024-04-11 LAB — LIPASE, BLOOD: Lipase: 18 U/L (ref 11–51)

## 2024-04-11 MED ORDER — OMEPRAZOLE 20 MG PO CPDR: 20.0000 mg | DELAYED_RELEASE_CAPSULE | Freq: Every day | ORAL | 0 refills | Status: AC

## 2024-04-11 NOTE — ED Triage Notes (Signed)
 Pt reports that she has had intermittent generalized abdominal pain x 1 week, intermittent right arm pain and numbness x 2 weeks, and intermittent dizziness x 2-3 weeks.

## 2024-04-11 NOTE — ED Provider Notes (Signed)
 " [employer] EMERGENCY DEPARTMENT AT [facility] Provider Note   CSN: [id number] Arrival date & time: 04/11/24  9389     Patient presents with: Abdominal Pain, Dizziness, and Arm Pain   Lindsey Ortega is a 64 y.o. female.    Abdominal Pain Dizziness Arm Pain Associated symptoms include abdominal pain.  Patient presents with months of various complaints.  Abdominal pain.  Worse after eating.  Nausea.  Worse in right upper abdomen.  No fevers.  Also has had dizziness at times.  Comes and goes.  Also right sided arm pain and numbness.  Also chest pain on the left chest that will come when the right arm gets extra numb.  States will stay somewhat numb at baseline.  Has been seen in the ER for similar symptoms and no clear cause has been found.     Prior to Admission medications  Medication Sig Start Date End Date Taking? Authorizing Provider  omeprazole  (PRILOSEC) 20 MG capsule Take 1 capsule (20 mg total) by mouth daily. 04/11/24  Yes Patsey Lot, MD  benzonatate  (TESSALON ) 100 MG capsule Take 1 capsule (100 mg total) by mouth every 8 (eight) hours. 03/10/24   Aberman, Caroline C, PA-C  cyclobenzaprine  (FLEXERIL ) 10 MG tablet Take 1 tablet (10 mg total) by mouth 2 (two) times daily as needed for muscle spasms. 04/28/20   Doretha Folks, MD  diphenhydramine-acetaminophen  (TYLENOL  PM EXTRA STRENGTH) 25-500 MG TABS tablet Take 1 tablet by mouth at bedtime as needed (pain/sleep/headache).    [provider]  meclizine  (ANTIVERT ) 25 MG tablet Take 1 tablet (25 mg total) by mouth 3 (three) times daily as needed for dizziness. 10/29/23   Zelaya, Oscar A, PA-C  metoCLOPramide  (REGLAN ) 10 MG tablet Take 2 tablets (20 mg total) by mouth 4 (four) times daily -  before meals and at bedtime. 02/09/24   Garrick Charleston, MD  naproxen  (NAPROSYN ) 375 MG tablet Take 1 tablet (375 mg total) by mouth 2 (two) times daily. 04/28/20   Doretha Folks, MD  ondansetron  (ZOFRAN ) 4  MG tablet Take 1 tablet (4 mg total) by mouth every 6 (six) hours. 05/23/23   Bauer, Collin S, PA-C  ondansetron  (ZOFRAN -ODT) 4 MG disintegrating tablet Take 1 tablet (4 mg total) by mouth every 8 (eight) hours as needed. 03/10/24   Aberman, Caroline C, PA-C    Allergies: Penicillin g, Percocet [oxycodone -acetaminophen ], and Latex    Review of Systems  Gastrointestinal:  Positive for abdominal pain.  Neurological:  Positive for dizziness.    Updated Vital Signs BP (!) 141/93 (BP Location: Right Arm)   Pulse 72   Temp 98.3 F (36.8 C) (Oral)   Resp 16   LMP 09/03/2017   SpO2 100%   Physical Exam Vitals and nursing note reviewed.  Cardiovascular:     Rate and Rhythm: Normal rate.  Pulmonary:     Comments: Anterior chest tenderness. Abdominal:     Tenderness: There is abdominal tenderness.     Comments: Right upper quadrant tenderness no rebound or guarding.  No hernia palpated.  Neurological:     General: No focal deficit present.     Mental Status: She is alert.     Comments: Good grip strength bilaterally.  Sensation grossly intact in bilateral upper extremities.  Face symmetric.  Eye movements intact.     (all labs ordered are listed, but only abnormal results are displayed) Labs Reviewed  COMPREHENSIVE METABOLIC PANEL WITH GFR - Abnormal; Notable for the following components:  Result Value   Glucose, Bld 134 (*)    BUN 7 (*)    All other components within normal limits  URINALYSIS, ROUTINE W REFLEX MICROSCOPIC - Abnormal; Notable for the following components:   APPearance CLOUDY (*)    Ketones, ur 5 (*)    Leukocytes,Ua LARGE (*)    Bacteria, UA RARE (*)    All other components within normal limits  LIPASE, BLOOD  CBC  TROPONIN T, HIGH SENSITIVITY    EKG: EKG Interpretation Date/Time:  Saturday April 11 2024 06:26:02 EST Ventricular Rate:  78 PR Interval:  178 QRS Duration:  84 QT Interval:  384 QTC Calculation: 438 R Axis:   62  Text  Interpretation: Sinus rhythm Confirmed by Patsey Lot [phone]) on 04/11/2024 7:47:27 AM  Radiology: US  Abdomen Limited Result Date: 04/11/2024 EXAM: US  ABDOMEN LIMITED 04/11/2024 11:00:00 AM TECHNIQUE: Real-time ultrasonography of the right upper quadrant of the abdomen was performed. COMPARISON: CT scans from 02/09/2024 and 05/29/2018. CLINICAL HISTORY: Right upper quadrant pain. FINDINGS: LIVER: Several small hyperechoic hepatic lesions corresponding to the enhancing lesions shown on CT scans from 02/09/2024 and 05/29/2018, favoring hemangiomas based on hyperechogenicity, flash filling, and delayed enhancement on prior CT scans. Normal echogenicity. No intrahepatic biliary ductal dilatation. Hepatopetal flow in the portal vein. BILIARY SYSTEM: Gallbladder wall thickness measures 2.0 mm. No pericholecystic fluid. No cholelithiasis. Common bile duct measures 2.0 mm. OTHER: No right upper quadrant ascites. IMPRESSION: 1. No acute hepatobiliary abnormality. 2. Small hepatic hemangiomas noted. Electronically signed by: Ryan Salvage MD 04/11/2024 01:18 PM EST RP Workstation: [id number]     Procedures   Medications Ordered in the ED - No data to display                                  Medical Decision Making Amount and/or Complexity of Data Reviewed Labs: ordered. Radiology: ordered.  Risk Prescription drug management.   Patient with anterior chest pain.  Associated with right arm numbness.  Comes and goes.  Has been seen previously with no clear cause found.  Also abdominal pain.  Right upper quadrant.  Also seen with negative CT scan.  However with the right upper quadrant pain biliary disease considered.  Will get ultrasound to evaluate.  Reviewed previous head CT and CTA head and neck for the right sided arm symptoms.  Both reassuring.  Do not think we need to repeat today.  Will need neurology follow-up.  Ultrasound negative.  Will have follow-up with neurology and GI.  Will  start on Prilosec.  Had been previously on Protonix  but that appears to have been months ago.  Will discharge.     Final diagnoses:  Abdominal pain, unspecified abdominal location  Paresthesia    ED Discharge Orders          Ordered    Ambulatory referral to Neurology       Comments: An appointment is requested in approximately: 2 weeks   04/11/24 1338    omeprazole  (PRILOSEC) 20 MG capsule  Daily        04/11/24 1338               Patsey Lot, MD 04/11/24 1451  "
